# Patient Record
Sex: Female | Born: 1960 | Race: Black or African American | Hispanic: No | Marital: Single | Smoking: Never smoker
Health system: Southern US, Community
[De-identification: ages and names within clinical notes are randomized; demographics above are authoritative.]

## PROBLEM LIST (undated history)

## (undated) DIAGNOSIS — Z973 Presence of spectacles and contact lenses: Secondary | ICD-10-CM

## (undated) DIAGNOSIS — Z862 Personal history of diseases of the blood and blood-forming organs and certain disorders involving the immune mechanism: Secondary | ICD-10-CM

## (undated) DIAGNOSIS — E559 Vitamin D deficiency, unspecified: Secondary | ICD-10-CM

## (undated) DIAGNOSIS — M545 Low back pain, unspecified: Secondary | ICD-10-CM

## (undated) DIAGNOSIS — S62109A Fracture of unspecified carpal bone, unspecified wrist, initial encounter for closed fracture: Secondary | ICD-10-CM

## (undated) DIAGNOSIS — F419 Anxiety disorder, unspecified: Secondary | ICD-10-CM

## (undated) DIAGNOSIS — E7800 Pure hypercholesterolemia, unspecified: Secondary | ICD-10-CM

## (undated) DIAGNOSIS — I10 Essential (primary) hypertension: Secondary | ICD-10-CM

## (undated) DIAGNOSIS — N390 Urinary tract infection, site not specified: Secondary | ICD-10-CM

## (undated) HISTORY — DX: Pure hypercholesterolemia, unspecified: E78.00

## (undated) HISTORY — PX: ABLATION: SHX5711

## (undated) HISTORY — PX: ANKLE SURGERY: SHX546

---

## 2001-04-12 ENCOUNTER — Other Ambulatory Visit: Admission: RE | Admit: 2001-04-12 | Discharge: 2001-04-12 | Payer: Self-pay | Admitting: Obstetrics and Gynecology

## 2002-05-16 ENCOUNTER — Other Ambulatory Visit: Admission: RE | Admit: 2002-05-16 | Discharge: 2002-05-16 | Payer: Self-pay | Admitting: Obstetrics and Gynecology

## 2003-06-05 ENCOUNTER — Other Ambulatory Visit: Admission: RE | Admit: 2003-06-05 | Discharge: 2003-06-05 | Payer: Self-pay | Admitting: Obstetrics and Gynecology

## 2005-04-05 ENCOUNTER — Other Ambulatory Visit: Admission: RE | Admit: 2005-04-05 | Discharge: 2005-04-05 | Payer: Self-pay | Admitting: Obstetrics and Gynecology

## 2008-05-22 ENCOUNTER — Ambulatory Visit: Payer: Self-pay | Admitting: Radiology

## 2008-05-22 ENCOUNTER — Emergency Department (HOSPITAL_BASED_OUTPATIENT_CLINIC_OR_DEPARTMENT_OTHER): Admission: EM | Admit: 2008-05-22 | Discharge: 2008-05-22 | Payer: Self-pay | Admitting: Emergency Medicine

## 2010-03-24 IMAGING — CR DG ANKLE COMPLETE 3+V*L*
3 series · 3 of 3 positions shown · non-contrast
Comparison: None.

Addendum Begins

Body of the report should read; left fifth metatarsal head
Addendum Ends
CLINICAL DATA: Twisting injury.
LEFT ANKLE COMPLETE - 3+ VIEW

[t ankle joint ap left]
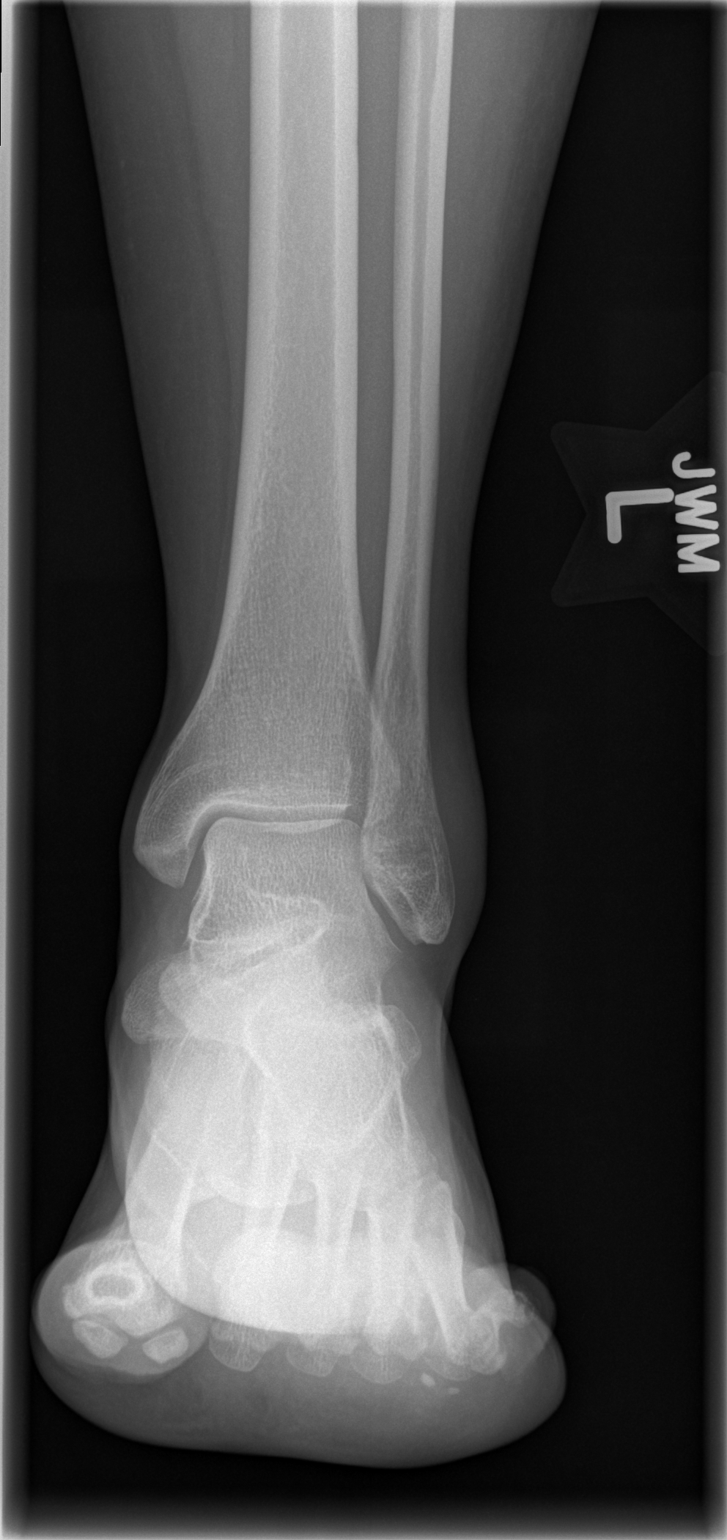

[t ankle joint oblique left]
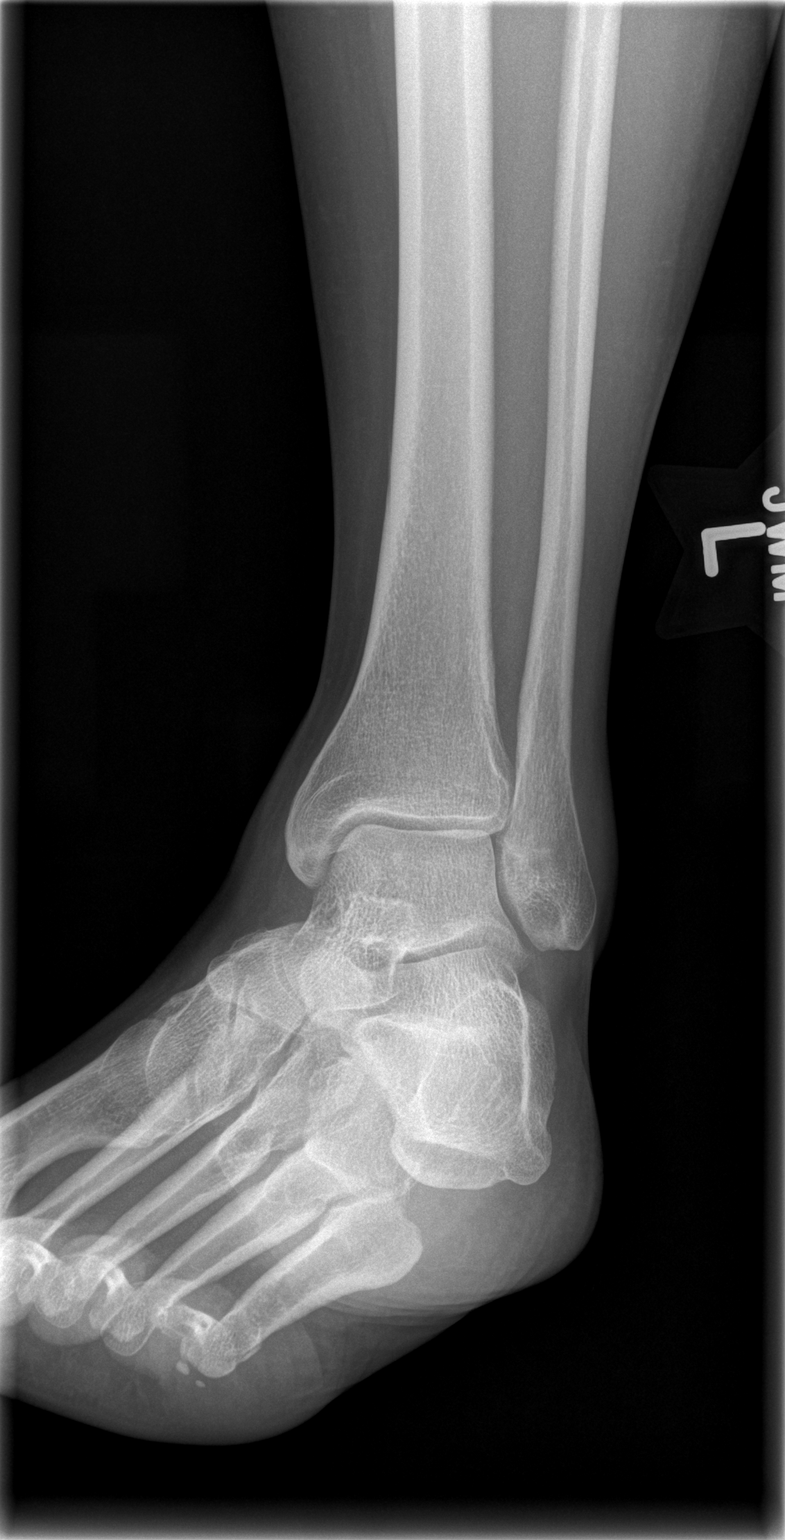

[t ankle joint lat left]
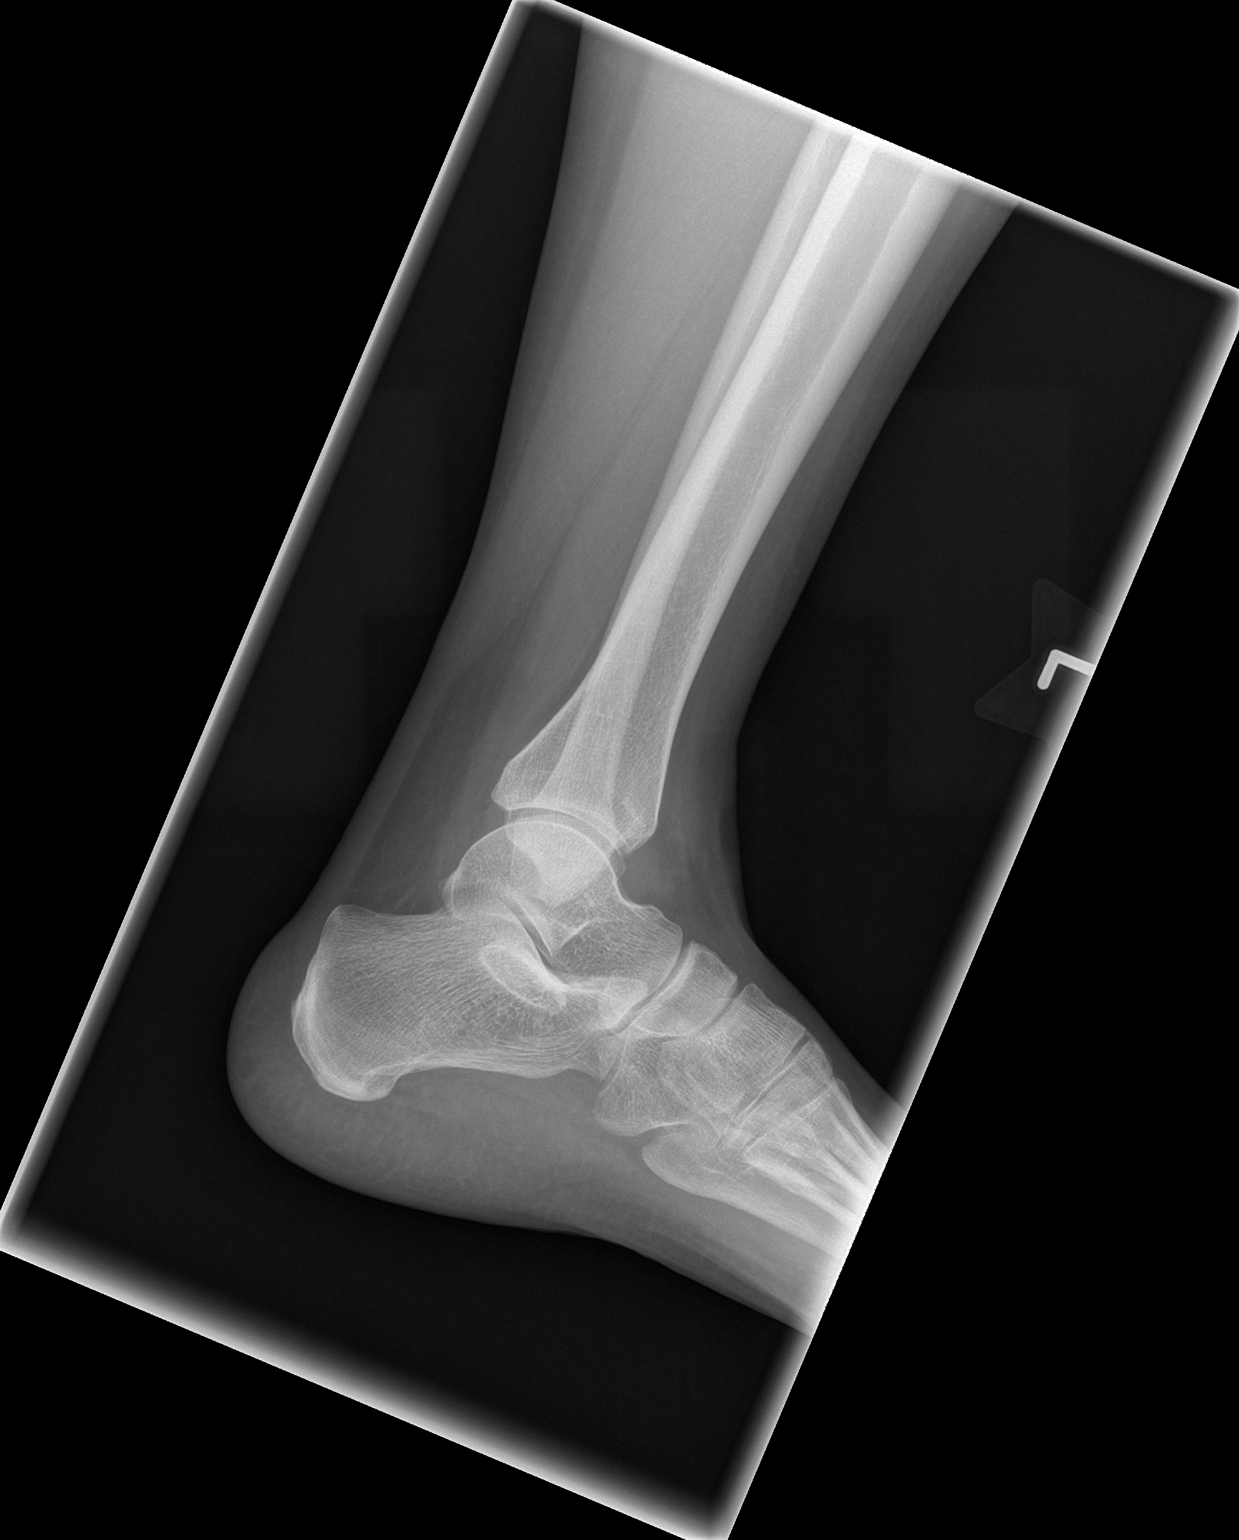

[3 of 3 positions shown; findings below may reference images not displayed]

FINDINGS: No fracture or dislocation involving the ankle.  On one
view there is a questionable abnormality of the left fifth
metacarpal head which I suspect is related to projection however if
the patient were tender here then cone down views recommended.
IMPRESSION: No ankle fracture.

Question artifact involving the left fifth metatarsal head as
discussed above

## 2011-10-25 ENCOUNTER — Encounter (HOSPITAL_BASED_OUTPATIENT_CLINIC_OR_DEPARTMENT_OTHER): Payer: Self-pay | Admitting: *Deleted

## 2011-10-25 ENCOUNTER — Emergency Department (HOSPITAL_BASED_OUTPATIENT_CLINIC_OR_DEPARTMENT_OTHER)
Admission: EM | Admit: 2011-10-25 | Discharge: 2011-10-25 | Disposition: A | Discharge status: Home / Self Care | Payer: Managed Care, Other (non HMO) | Attending: Emergency Medicine | Admitting: Emergency Medicine

## 2011-10-25 DIAGNOSIS — R109 Unspecified abdominal pain: Secondary | ICD-10-CM | POA: Insufficient documentation

## 2011-10-25 DIAGNOSIS — N83201 Unspecified ovarian cyst, right side: Secondary | ICD-10-CM

## 2011-10-25 DIAGNOSIS — E876 Hypokalemia: Secondary | ICD-10-CM

## 2011-10-25 DIAGNOSIS — N83209 Unspecified ovarian cyst, unspecified side: Secondary | ICD-10-CM | POA: Insufficient documentation

## 2011-10-25 LAB — URINALYSIS, ROUTINE W REFLEX MICROSCOPIC
Nitrite: NEGATIVE
Specific Gravity, Urine: 1.027 (ref 1.005–1.030)
Urobilinogen, UA: 0.2 mg/dL (ref 0.0–1.0)

## 2011-10-25 LAB — WET PREP, GENITAL

## 2011-10-25 LAB — CBC WITH DIFFERENTIAL/PLATELET
Eosinophils Relative: 1 % (ref 0–5)
MCH: 22.7 pg — ABNORMAL LOW (ref 26.0–34.0)
Monocytes Absolute: 0.4 10*3/uL (ref 0.1–1.0)
Monocytes Relative: 9 % (ref 3–12)
Neutrophils Relative %: 48 % (ref 43–77)
Platelets: 190 10*3/uL (ref 150–400)
RBC: 5.43 MIL/uL — ABNORMAL HIGH (ref 3.87–5.11)
WBC: 4.2 10*3/uL (ref 4.0–10.5)

## 2011-10-25 LAB — BASIC METABOLIC PANEL
BUN: 10 mg/dL (ref 6–23)
Calcium: 8.8 mg/dL (ref 8.4–10.5)
GFR calc Af Amer: >90 mL/min (ref >90)
GFR calc non Af Amer: >90 mL/min (ref >90)
Potassium: 2.6 mEq/L — CL (ref 3.5–5.1)

## 2011-10-25 MED ORDER — NAPROXEN 500 MG PO TABS: 500.0000 mg | ORAL_TABLET | Freq: Two times a day (BID) | ORAL | Status: AC

## 2011-10-25 MED ORDER — POTASSIUM CHLORIDE CRYS ER 20 MEQ PO TBCR
40.0000 meq | EXTENDED_RELEASE_TABLET | Freq: Once | ORAL | Status: AC
  Administered 2011-10-25: 40 meq via ORAL
  Filled 2011-10-25: qty 2

## 2011-10-25 MED ORDER — OXYCODONE-ACETAMINOPHEN 5-325 MG PO TABS: 1.0000 | ORAL_TABLET | ORAL | Status: AC | PRN

## 2011-10-25 MED ORDER — POTASSIUM CHLORIDE CRYS ER 20 MEQ PO TBCR: 40.0000 meq | EXTENDED_RELEASE_TABLET | Freq: Two times a day (BID) | ORAL | Status: DC

## 2011-10-25 MED ORDER — POTASSIUM CHLORIDE 10 MEQ/100ML IV SOLN
10.0000 meq | Freq: Once | INTRAVENOUS | Status: AC
  Administered 2011-10-25: 10 meq via INTRAVENOUS
  Filled 2011-10-25: qty 100

## 2011-10-25 NOTE — ED Notes (Signed)
Pt sts pain not present now, it comes and goes

## 2011-10-25 NOTE — ED Notes (Signed)
MD notified of potassium 2.6.  

## 2011-10-25 NOTE — ED Provider Notes (Signed)
History     CSN: [id number]  Arrival date & time 10/25/11  0344   First MD Initiated Contact with Patient 10/25/11 0454      Chief Complaint  Patient presents with  . Abdominal Pain    (Consider location/radiation/quality/duration/timing/severity/associated sxs/prior treatment) Patient is a 51 y.o. female presenting with abdominal pain. The history is provided by the patient.  Abdominal Pain The primary symptoms of the illness include abdominal pain.  She has noticed some right suprapubic pain which has been present intermittently for the last 4 days. Pain comes and goes without any particular pattern but can be present for several hours at a time. It is moderate in intensity with worse pain having been 5/10. She currently is pain free. Pain seems to be worse when she is laying down and that does seem to be improved following ibuprofen. It is dull and crampy in nature and does not radiate. There is no associated nausea, vomiting, diarrhea. Her appetite has been normal. There has been no urinary urgency, frequency, and tenesmus. She has noted a vaginal discharge. She is status post uterine ablation so she is not aware of her menstrual cycle.  History reviewed. No pertinent past medical history.  History reviewed. No pertinent past surgical history.  History reviewed. No pertinent family history.  History  Substance Use Topics  . Smoking status: Never Smoker   . Smokeless tobacco: Not on file  . Alcohol Use: Yes    OB History    Grav Para Term Preterm Abortions TAB SAB Ect Mult Living                  Review of Systems  Gastrointestinal: Positive for abdominal pain.  All other systems reviewed and are negative.    Allergies  Review of patient's allergies indicates no known allergies.  Home Medications   Current Outpatient Rx  Name Route Sig Dispense Refill  . ALPRAZOLAM 1 MG PO TABS Oral Take 1 mg by mouth at bedtime as needed.    Marland Kitchen PHENTERMINE HCL 37.5 MG PO CAPS  Oral Take 37.5 mg by mouth every morning.      BP 124/76  Pulse 80  Temp 97.8 F (36.6 C) (Oral)  Resp 19  Ht 5\' 2"  (1.575 m)  Wt 155 lb (70.308 kg)  BMI 28.35 kg/m2  SpO2 99%  Physical Exam  Nursing note and vitals reviewed.  51 year old female who is resting comfortably and in no acute distress. Vital signs are normal. Oxygen saturation is 99% which is normal. Head is normocephalic and atraumatic. PERRLA, EOMI. Neck is nontender and supple. Back is nontender. Lungs are clear without rales, wheezes, or rhonchi. Heart has regular rate and rhythm without murmur. Abdomen is soft, flat, with mild right suprapubic tenderness. There is no rebound or guarding. Peristalsis is normal active. No masses or hepatosplenomegaly. Extremities have no cyanosis or edema, full range of motion is present. Skin is warm and dry without rash. Neurologic: Mental status is normal, cranial nerves are intact, there are no motor or sensory deficits.  ED Course  Procedures (including critical care time)  Results for orders placed during the hospital encounter of 10/25/11  URINALYSIS, ROUTINE W REFLEX MICROSCOPIC      Component Value Range   Color, Urine AMBER (*) YELLOW   APPearance CLOUDY (*) CLEAR   Specific Gravity, Urine 1.027  1.005 - 1.030   pH 6.0  5.0 - 8.0   Glucose, UA NEGATIVE  NEGATIVE mg/dL   Hgb urine  dipstick NEGATIVE  NEGATIVE   Bilirubin Urine SMALL (*) NEGATIVE   Ketones, ur 15 (*) NEGATIVE mg/dL   Protein, ur NEGATIVE  NEGATIVE mg/dL   Urobilinogen, UA 0.2  0.0 - 1.0 mg/dL   Nitrite NEGATIVE  NEGATIVE   Leukocytes, UA NEGATIVE  NEGATIVE  CBC WITH DIFFERENTIAL      Component Value Range   WBC 4.2  4.0 - 10.5 K/uL   RBC 5.43 (*) 3.87 - 5.11 MIL/uL   Hemoglobin 12.3  12.0 - 15.0 g/dL   HCT 16.1 (*) 09.6 - 04.5 %   MCV 65.6 (*) 78.0 - 100.0 fL   MCH 22.7 (*) 26.0 - 34.0 pg   MCHC 34.6  30.0 - 36.0 g/dL   RDW 40.9  81.1 - 91.4 %   Platelets 190  150 - 400 K/uL   Neutrophils Relative  48  43 - 77 %   Lymphocytes Relative 42  12 - 46 %   Monocytes Relative 9  3 - 12 %   Eosinophils Relative 1  0 - 5 %   Basophils Relative 0  0 - 1 %   Neutro Abs 2.0  1.7 - 7.7 K/uL   Lymphs Abs 1.8  0.7 - 4.0 K/uL   Monocytes Absolute 0.4  0.1 - 1.0 K/uL   Eosinophils Absolute 0.0  0.0 - 0.7 K/uL   Basophils Absolute 0.0  0.0 - 0.1 K/uL   RBC Morphology ELLIPTOCYTES     WBC Morphology WHITE COUNT CONFIRMED ON SMEAR     Smear Review PLATELET COUNT CONFIRMED BY SMEAR    BASIC METABOLIC PANEL      Component Value Range   Sodium 139  135 - 145 mEq/L   Potassium 2.6 (*) 3.5 - 5.1 mEq/L   Chloride 102  96 - 112 mEq/L   CO2 26  19 - 32 mEq/L   Glucose, Bld 96  70 - 99 mg/dL   BUN 10  6 - 23 mg/dL   Creatinine, Ser 7.82  0.50 - 1.10 mg/dL   Calcium 8.8  8.4 - 95.6 mg/dL   GFR calc non Af Amer >90  >90 mL/min   GFR calc Af Amer >90  >90 mL/min  WET PREP, GENITAL      Component Value Range   Yeast Wet Prep HPF POC NONE SEEN  NONE SEEN   Trich, Wet Prep NONE SEEN  NONE SEEN   Clue Cells Wet Prep HPF POC FEW (*) NONE SEEN   WBC, Wet Prep HPF POC FEW (*) NONE SEEN     1. Right ovarian cyst   2. Hypokalemia       MDM  Right pelvic pain which is most likely related to ovarian cyst. Although I doubt appendicitis, WBC will be checked and, if elevated, will consider CT scan.  Pelvic exam shows normal external female genitalia. Cervix appears normal. There is a moderate creamy white vaginal discharge present. His fundus is normal size and position and nontender and there is no cervical motion tenderness. There is no left adnexal tenderness or mass. There is tenderness to palpation over the right ovary which is normal size. No other adnexal mass or tenderness identified. At this point, it seems fairly clear that her pain is related to the right ovary.  WBC is normal, but potassium came back unexpectedly very low-2.6. At this level, she will need a dose of potassium intravenously as well as  oral potassium. She'll be sent home with prescriptions for naproxen, potassium,  and Percocet. She is instructed to follow up with her PCP in 2 weeks to repeat the potassium.    Dione Booze, MD 10/25/11 [phone]

## 2011-10-25 NOTE — ED Notes (Signed)
C/o RLQ pain and vaginal d/c for few days, denies urinary sxs.

## 2012-10-28 ENCOUNTER — Emergency Department (HOSPITAL_BASED_OUTPATIENT_CLINIC_OR_DEPARTMENT_OTHER): Payer: Managed Care, Other (non HMO)

## 2012-10-28 ENCOUNTER — Encounter (HOSPITAL_BASED_OUTPATIENT_CLINIC_OR_DEPARTMENT_OTHER): Payer: Self-pay | Admitting: *Deleted

## 2012-10-28 ENCOUNTER — Emergency Department (HOSPITAL_BASED_OUTPATIENT_CLINIC_OR_DEPARTMENT_OTHER)
Admission: EM | Admit: 2012-10-28 | Discharge: 2012-10-28 | Disposition: A | Discharge status: Home / Self Care | Payer: Managed Care, Other (non HMO) | Attending: Emergency Medicine | Admitting: Emergency Medicine

## 2012-10-28 DIAGNOSIS — S8990XA Unspecified injury of unspecified lower leg, initial encounter: Secondary | ICD-10-CM | POA: Insufficient documentation

## 2012-10-28 DIAGNOSIS — M25561 Pain in right knee: Secondary | ICD-10-CM

## 2012-10-28 DIAGNOSIS — Y9289 Other specified places as the place of occurrence of the external cause: Secondary | ICD-10-CM | POA: Insufficient documentation

## 2012-10-28 DIAGNOSIS — Z79899 Other long term (current) drug therapy: Secondary | ICD-10-CM | POA: Insufficient documentation

## 2012-10-28 DIAGNOSIS — Y9389 Activity, other specified: Secondary | ICD-10-CM | POA: Insufficient documentation

## 2012-10-28 DIAGNOSIS — R52 Pain, unspecified: Secondary | ICD-10-CM | POA: Insufficient documentation

## 2012-10-28 DIAGNOSIS — X500XXA Overexertion from strenuous movement or load, initial encounter: Secondary | ICD-10-CM | POA: Insufficient documentation

## 2012-10-28 MED ORDER — HYDROCODONE-ACETAMINOPHEN 5-325 MG PO TABS: 2.0000 | ORAL_TABLET | ORAL | Status: DC | PRN

## 2012-10-28 NOTE — ED Notes (Signed)
Pt reports that she has had (R) knee pain x 1 week.  Pt ambulatory.
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
## 2012-11-05 ENCOUNTER — Ambulatory Visit (INDEPENDENT_AMBULATORY_CARE_PROVIDER_SITE_OTHER): Payer: Managed Care, Other (non HMO) | Admitting: Family Medicine

## 2012-11-05 ENCOUNTER — Encounter: Payer: Self-pay | Admitting: Family Medicine

## 2012-11-05 VITALS — BP 131/87 | HR 79 | Ht 62.0 in | Wt 144.0 lb

## 2012-11-05 DIAGNOSIS — M25569 Pain in unspecified knee: Secondary | ICD-10-CM

## 2012-11-05 DIAGNOSIS — M25561 Pain in right knee: Secondary | ICD-10-CM

## 2012-11-05 NOTE — Patient Instructions (Addendum)
You have a medial meniscus tear of your knee. Take tylenol 500mg  1-2 tabs three times a day for pain. Aleve 1-2 tabs twice a day with food Capsaicin topically up to four times a day may also help with pain. Cortisone injections are an option though you would only do one for this issue. Consider physical therapy to strengthen muscles around the joint that hurts to take pressure off of the joint itself. Start straight leg raises and knee extensions 3 sets of 10 once a day with ankle weight. Heat or ice 15 minutes at a time 3-4 times a day as needed to help with pain. Follow up with me in 1 month for reevaluation.

## 2012-11-06 ENCOUNTER — Encounter: Payer: Self-pay | Admitting: Family Medicine

## 2012-11-06 DIAGNOSIS — M25561 Pain in right knee: Secondary | ICD-10-CM | POA: Insufficient documentation
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
## 2012-11-06 NOTE — Assessment & Plan Note (Signed)
radiographs normal without arthritis.  Location, injury, and exam consistent with medial meniscus tear.  Start with conservative therapy.  Icing, tylenol, aleve, capsaicin.  Consider PT, cortisone injection.  Shown home exercise program.  F/u in 1 month for reevaluation.

## 2012-12-07 ENCOUNTER — Ambulatory Visit: Payer: Managed Care, Other (non HMO) | Admitting: Family Medicine

## 2012-12-10 ENCOUNTER — Encounter: Payer: Self-pay | Admitting: Family Medicine

## 2012-12-10 ENCOUNTER — Ambulatory Visit (INDEPENDENT_AMBULATORY_CARE_PROVIDER_SITE_OTHER): Payer: Managed Care, Other (non HMO) | Admitting: Family Medicine

## 2012-12-10 VITALS — BP 134/87 | HR 72 | Ht 62.0 in | Wt 144.0 lb

## 2012-12-10 DIAGNOSIS — M25561 Pain in right knee: Secondary | ICD-10-CM

## 2012-12-10 DIAGNOSIS — M25569 Pain in unspecified knee: Secondary | ICD-10-CM

## 2012-12-10 NOTE — Patient Instructions (Addendum)
We will go ahead with an MRI to further evaluate your knee, confirm meniscus tear. I will call you the business day following this with the results.

## 2012-12-11 ENCOUNTER — Encounter: Payer: Self-pay | Admitting: Family Medicine
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
## 2012-12-11 NOTE — Assessment & Plan Note (Signed)
radiographs normal without arthritis.  Location, injury, and exam consistent with medial meniscus tear.  Not improving with HEP, icing, tylenol, nsaids (aleve).  Will move forward with MRI to assess for medial meniscus tear.

## 2013-03-12 ENCOUNTER — Emergency Department (HOSPITAL_BASED_OUTPATIENT_CLINIC_OR_DEPARTMENT_OTHER)
Admission: EM | Admit: 2013-03-12 | Discharge: 2013-03-12 | Disposition: A | Discharge status: Home / Self Care | Payer: Managed Care, Other (non HMO) | Attending: Emergency Medicine | Admitting: Emergency Medicine

## 2013-03-12 ENCOUNTER — Encounter (HOSPITAL_BASED_OUTPATIENT_CLINIC_OR_DEPARTMENT_OTHER): Payer: Self-pay | Admitting: Emergency Medicine

## 2013-03-12 DIAGNOSIS — M549 Dorsalgia, unspecified: Secondary | ICD-10-CM

## 2013-03-12 DIAGNOSIS — M545 Low back pain, unspecified: Secondary | ICD-10-CM | POA: Insufficient documentation

## 2013-03-12 DIAGNOSIS — F411 Generalized anxiety disorder: Secondary | ICD-10-CM | POA: Insufficient documentation

## 2013-03-12 DIAGNOSIS — Z79899 Other long term (current) drug therapy: Secondary | ICD-10-CM | POA: Insufficient documentation

## 2013-03-12 DIAGNOSIS — Z791 Long term (current) use of non-steroidal anti-inflammatories (NSAID): Secondary | ICD-10-CM | POA: Insufficient documentation

## 2013-03-12 DIAGNOSIS — Z3202 Encounter for pregnancy test, result negative: Secondary | ICD-10-CM | POA: Insufficient documentation

## 2013-03-12 HISTORY — DX: Anxiety disorder, unspecified: F41.9

## 2013-03-12 LAB — URINALYSIS, ROUTINE W REFLEX MICROSCOPIC
Leukocytes, UA: NEGATIVE
Nitrite: NEGATIVE
Specific Gravity, Urine: 1.027 (ref 1.005–1.030)
pH: 5.5 (ref 5.0–8.0)

## 2013-03-12 LAB — PREGNANCY, URINE: Preg Test, Ur: NEGATIVE

## 2013-03-12 MED ORDER — HYDROCODONE-ACETAMINOPHEN 5-325 MG PO TABS: 2.0000 | ORAL_TABLET | Freq: Once | ORAL | Status: DC

## 2013-03-12 MED ORDER — IBUPROFEN 400 MG PO TABS
600.0000 mg | ORAL_TABLET | Freq: Once | ORAL | Status: AC
  Administered 2013-03-12: 600 mg via ORAL
  Filled 2013-03-12 (×2): qty 1

## 2013-03-12 MED ORDER — IBUPROFEN 600 MG PO TABS: 600.0000 mg | ORAL_TABLET | Freq: Three times a day (TID) | ORAL | Status: DC

## 2013-03-12 MED ORDER — DIAZEPAM 5 MG PO TABS: 5.0000 mg | ORAL_TABLET | Freq: Three times a day (TID) | ORAL | Status: DC | PRN

## 2013-03-12 MED ORDER — HYDROCODONE-ACETAMINOPHEN 5-325 MG PO TABS
1.0000 | ORAL_TABLET | Freq: Once | ORAL | Status: AC
  Administered 2013-03-12: 1 via ORAL
  Filled 2013-03-12: qty 1

## 2013-03-12 MED ORDER — DIAZEPAM 5 MG PO TABS
5.0000 mg | ORAL_TABLET | Freq: Once | ORAL | Status: AC
  Administered 2013-03-12: 5 mg via ORAL
  Filled 2013-03-12: qty 1

## 2013-03-12 MED ORDER — HYDROCODONE-ACETAMINOPHEN 5-325 MG PO TABS: 1.0000 | ORAL_TABLET | Freq: Four times a day (QID) | ORAL | Status: DC | PRN

## 2013-03-12 NOTE — ED Provider Notes (Signed)
CSN: [id number]     Arrival date & time 03/12/13  1044 History   First MD Initiated Contact with Patient 03/12/13 1051     Chief Complaint  Patient presents with  . Back Pain   (Consider location/radiation/quality/duration/timing/severity/associated sxs/prior Treatment) Patient is a 52 y.o. female presenting with back pain. The history is provided by the patient.  Back Pain Location:  Lumbar spine Quality:  Aching Radiates to:  Does not radiate Pain severity:  Moderate Pain is:  Same all the time Onset quality:  Sudden Timing:  Constant Progression:  Unchanged Chronicity:  New Context: twisting (with bending over)   Relieved by:  Nothing Worsened by:  Bending, twisting and movement Associated symptoms: no abdominal pain, no chest pain and no fever     Past Medical History  Diagnosis Date  . Anxiety    Past Surgical History  Procedure Laterality Date  . Cesarean section    . Ablation     Family History  Problem Relation Age of Onset  . Diabetes Mother   . Hyperlipidemia Mother   . Hypertension Mother   . Heart attack Father   . Hyperlipidemia Father   . Sudden death Neg Hx    History  Substance Use Topics  . Smoking status: Never Smoker   . Smokeless tobacco: Not on file  . Alcohol Use: Yes     Comment: socially   OB History   Grav Para Term Preterm Abortions TAB SAB Ect Mult Living                 Review of Systems  Constitutional: Negative for fever.  Cardiovascular: Negative for chest pain and leg swelling.  Gastrointestinal: Negative for nausea, vomiting, abdominal pain and diarrhea.  Genitourinary: Negative for difficulty urinating.  Musculoskeletal: Positive for back pain.  All other systems reviewed and are negative.    Allergies  Review of patient's allergies indicates no known allergies.  Home Medications   Current Outpatient Rx  Name  Route  Sig  Dispense  Refill  . ALPRAZolam (XANAX) 1 MG tablet   Oral   Take 1 mg by mouth at bedtime  as needed.         . diazepam (VALIUM) 5 MG tablet   Oral   Take 1 tablet (5 mg total) by mouth every 8 (eight) hours as needed for muscle spasms.   30 tablet   0   . HYDROcodone-acetaminophen (NORCO/VICODIN) 5-325 MG per tablet   Oral   Take 2 tablets by mouth every 4 (four) hours as needed for pain.   15 tablet   0   . HYDROcodone-acetaminophen (NORCO/VICODIN) 5-325 MG per tablet   Oral   Take 1 tablet by mouth every 6 (six) hours as needed for moderate pain.   20 tablet   0   . ibuprofen (ADVIL,MOTRIN) 600 MG tablet   Oral   Take 1 tablet (600 mg total) by mouth 3 (three) times daily.   21 tablet   0   . phentermine 37.5 MG capsule   Oral   Take 37.5 mg by mouth every morning.         Marland Kitchen EXPIRED: potassium chloride SA (K-DUR,KLOR-CON) 20 MEQ tablet   Oral   Take 2 tablets (40 mEq total) by mouth 2 (two) times daily.   40 tablet   0    BP 142/68  Pulse 76  Temp(Src) 98.3 F (36.8 C) (Oral)  Resp 16  Ht 5\' 2"  (1.575 m)  Wt 151 lb (68.493 kg)  BMI 27.61 kg/m2  SpO2 100% Physical Exam  Nursing note and vitals reviewed. Constitutional: She is oriented to person, place, and time. She appears well-developed and well-nourished. No distress.  HENT:  Head: Normocephalic and atraumatic.  Eyes: EOM are normal. Pupils are equal, round, and reactive to light.  Neck: Normal range of motion. Neck supple.  Cardiovascular: Normal rate and regular rhythm.  Exam reveals no friction rub.   No murmur heard. Pulmonary/Chest: Effort normal and breath sounds normal. No respiratory distress. She has no wheezes. She has no rales.  Abdominal: Soft. She exhibits no distension. There is no tenderness. There is no rebound.  Musculoskeletal: Normal range of motion. She exhibits no edema.       Lumbar back: She exhibits normal range of motion, no tenderness, no bony tenderness and no spasm.  Neurological: She is alert and oriented to person, place, and time.  Skin: She is not  diaphoretic.    ED Course  Procedures (including critical care time) Labs Review Labs Reviewed  URINALYSIS, ROUTINE W REFLEX MICROSCOPIC - Abnormal; Notable for the following:    Color, Urine AMBER (*)    Bilirubin Urine SMALL (*)    All other components within normal limits  PREGNANCY, URINE   Imaging Review No results found.  EKG Interpretation   None       MDM   1. Back pain    41F here with back pain. Began while bending over. Nonradiating, localized in L lower back. No urinary/fecal incontinence/retention. No N/V/D. No fevers. No reported saddle anesthesia. On exam, no muscle spasm. Normal reflexes, normal strength and sensation. Likely lumbar strain. Patient given valium and narcotics for her symptoms.     Dagmar Hait, MD 03/12/13 1400

## 2013-03-12 NOTE — ED Notes (Signed)
Pt reports she bent over yesterday and now has low back pain radiating to left flank.

## 2017-06-22 LAB — HEPATIC FUNCTION PANEL
ALT: 38 — AB (ref 7–35)
AST: 38 — AB (ref 13–35)

## 2017-06-22 LAB — LIPID PANEL
CHOLESTEROL: 206 — AB (ref 0–200)
HDL: 62 (ref 35–70)
LDL Cholesterol: 124
Triglycerides: 94 (ref 40–160)

## 2017-06-22 LAB — BASIC METABOLIC PANEL: POTASSIUM: 3.3 — AB (ref 3.4–5.3)

## 2017-06-27 ENCOUNTER — Encounter: Payer: Self-pay | Admitting: Internal Medicine

## 2017-06-27 ENCOUNTER — Ambulatory Visit: Payer: Managed Care, Other (non HMO) | Admitting: Internal Medicine

## 2017-06-27 VITALS — BP 130/78 | HR 103 | Temp 97.9°F | Ht 62.0 in | Wt 179.0 lb

## 2017-06-27 DIAGNOSIS — Z0000 Encounter for general adult medical examination without abnormal findings: Secondary | ICD-10-CM | POA: Diagnosis not present

## 2017-06-27 DIAGNOSIS — Z1211 Encounter for screening for malignant neoplasm of colon: Secondary | ICD-10-CM | POA: Diagnosis not present

## 2017-06-27 DIAGNOSIS — Z23 Encounter for immunization: Secondary | ICD-10-CM

## 2017-06-27 NOTE — Addendum Note (Signed)
Addended by: Eual FinesBRIDGES, SHANNON P on: 06/27/2017 12:47 PM   Modules accepted: Orders

## 2017-06-27 NOTE — Patient Instructions (Signed)
DASH Eating Plan DASH stands for "Dietary Approaches to Stop Hypertension." The DASH eating plan is a healthy eating plan that has been shown to reduce high blood pressure (hypertension). It may also reduce your risk for type 2 diabetes, heart disease, and stroke. The DASH eating plan may also help with weight loss. What are tips for following this plan? General guidelines  Avoid eating more than 2,300 mg (milligrams) of salt (sodium) a day. If you have hypertension, you may need to reduce your sodium intake to 1,500 mg a day.  Limit alcohol intake to no more than 1 drink a day for nonpregnant women and 2 drinks a day for men. One drink equals 12 oz of beer, 5 oz of wine, or 1 oz of hard liquor.  Work with your health care provider to maintain a healthy body weight or to lose weight. Ask what an ideal weight is for you.  Get at least 30 minutes of exercise that causes your heart to beat faster (aerobic exercise) most days of the week. Activities may include walking, swimming, or biking.  Work with your health care provider or diet and nutrition specialist (dietitian) to adjust your eating plan to your individual calorie needs. Reading food labels  Check food labels for the amount of sodium per serving. Choose foods with less than 5 percent of the Daily Value of sodium. Generally, foods with less than 300 mg of sodium per serving fit into this eating plan.  To find whole grains, look for the word "whole" as the first word in the ingredient list. Shopping  Buy products labeled as "low-sodium" or "no salt added."  Buy fresh foods. Avoid canned foods and premade or frozen meals. Cooking  Avoid adding salt when cooking. Use salt-free seasonings or herbs instead of table salt or sea salt. Check with your health care provider or pharmacist before using salt substitutes.  Do not fry foods. Cook foods using healthy methods such as baking, boiling, grilling, and broiling instead.  Cook with  heart-healthy oils, such as olive, canola, soybean, or sunflower oil. Meal planning   Eat a balanced diet that includes: ? 5 or more servings of fruits and vegetables each day. At each meal, try to fill half of your plate with fruits and vegetables. ? Up to 6-8 servings of whole grains each day. ? Less than 6 oz of lean meat, poultry, or fish each day. A 3-oz serving of meat is about the same size as a deck of cards. One egg equals 1 oz. ? 2 servings of low-fat dairy each day. ? A serving of nuts, seeds, or beans 5 times each week. ? Heart-healthy fats. Healthy fats called Omega-3 fatty acids are found in foods such as flaxseeds and coldwater fish, like sardines, salmon, and mackerel.  Limit how much you eat of the following: ? Canned or prepackaged foods. ? Food that is high in trans fat, such as fried foods. ? Food that is high in saturated fat, such as fatty meat. ? Sweets, desserts, sugary drinks, and other foods with added sugar. ? Full-fat dairy products.  Do not salt foods before eating.  Try to eat at least 2 vegetarian meals each week.  Eat more home-cooked food and less restaurant, buffet, and fast food.  When eating at a restaurant, ask that your food be prepared with less salt or no salt, if possible. What foods are recommended? The items listed may not be a complete list. Talk with your dietitian about what   dietary choices are best for you. Grains Whole-grain or whole-wheat bread. Whole-grain or whole-wheat pasta. Brown rice. Oatmeal. Quinoa. Bulgur. Whole-grain and low-sodium cereals. Pita bread. Low-fat, low-sodium crackers. Whole-wheat flour tortillas. Vegetables Fresh or frozen vegetables (raw, steamed, roasted, or grilled). Low-sodium or reduced-sodium tomato and vegetable juice. Low-sodium or reduced-sodium tomato sauce and tomato paste. Low-sodium or reduced-sodium canned vegetables. Fruits All fresh, dried, or frozen fruit. Canned fruit in natural juice (without  added sugar). Meat and other protein foods Skinless chicken or turkey. Ground chicken or turkey. Pork with fat trimmed off. Fish and seafood. Egg whites. Dried beans, peas, or lentils. Unsalted nuts, nut butters, and seeds. Unsalted canned beans. Lean cuts of beef with fat trimmed off. Low-sodium, lean deli meat. Dairy Low-fat (1%) or fat-free (skim) milk. Fat-free, low-fat, or reduced-fat cheeses. Nonfat, low-sodium ricotta or cottage cheese. Low-fat or nonfat yogurt. Low-fat, low-sodium cheese. Fats and oils Soft margarine without trans fats. Vegetable oil. Low-fat, reduced-fat, or light mayonnaise and salad dressings (reduced-sodium). Canola, safflower, olive, soybean, and sunflower oils. Avocado. Seasoning and other foods Herbs. Spices. Seasoning mixes without salt. Unsalted popcorn and pretzels. Fat-free sweets. What foods are not recommended? The items listed may not be a complete list. Talk with your dietitian about what dietary choices are best for you. Grains Baked goods made with fat, such as croissants, muffins, or some breads. Dry pasta or rice meal packs. Vegetables Creamed or fried vegetables. Vegetables in a cheese sauce. Regular canned vegetables (not low-sodium or reduced-sodium). Regular canned tomato sauce and paste (not low-sodium or reduced-sodium). Regular tomato and vegetable juice (not low-sodium or reduced-sodium). Pickles. Olives. Fruits Canned fruit in a light or heavy syrup. Fried fruit. Fruit in cream or butter sauce. Meat and other protein foods Fatty cuts of meat. Ribs. Fried meat. Bacon. Sausage. Bologna and other processed lunch meats. Salami. Fatback. Hotdogs. Bratwurst. Salted nuts and seeds. Canned beans with added salt. Canned or smoked fish. Whole eggs or egg yolks. Chicken or turkey with skin. Dairy Whole or 2% milk, cream, and half-and-half. Whole or full-fat cream cheese. Whole-fat or sweetened yogurt. Full-fat cheese. Nondairy creamers. Whipped toppings.  Processed cheese and cheese spreads. Fats and oils Butter. Stick margarine. Lard. Shortening. Ghee. Bacon fat. Tropical oils, such as coconut, palm kernel, or palm oil. Seasoning and other foods Salted popcorn and pretzels. Onion salt, garlic salt, seasoned salt, table salt, and sea salt. Worcestershire sauce. Tartar sauce. Barbecue sauce. Teriyaki sauce. Soy sauce, including reduced-sodium. Steak sauce. Canned and packaged gravies. Fish sauce. Oyster sauce. Cocktail sauce. Horseradish that you find on the shelf. Ketchup. Mustard. Meat flavorings and tenderizers. Bouillon cubes. Hot sauce and Tabasco sauce. Premade or packaged marinades. Premade or packaged taco seasonings. Relishes. Regular salad dressings. Where to find more information:  National Heart, Lung, and Blood Institute: www.nhlbi.nih.gov  American Heart Association: www.heart.org Summary  The DASH eating plan is a healthy eating plan that has been shown to reduce high blood pressure (hypertension). It may also reduce your risk for type 2 diabetes, heart disease, and stroke.  With the DASH eating plan, you should limit salt (sodium) intake to 2,300 mg a day. If you have hypertension, you may need to reduce your sodium intake to 1,500 mg a day.  When on the DASH eating plan, aim to eat more fresh fruits and vegetables, whole grains, lean proteins, low-fat dairy, and heart-healthy fats.  Work with your health care provider or diet and nutrition specialist (dietitian) to adjust your eating plan to your individual   calorie needs. This information is not intended to replace advice given to you by your health care provider. Make sure you discuss any questions you have with your health care provider. Document Released: 03/10/2011 Document Revised: 03/14/2016 Document Reviewed: 03/14/2016 Elsevier Interactive Patient Education  2018 Elsevier Inc.  

## 2017-06-27 NOTE — Progress Notes (Signed)
Subjective:    Patient ID: Gail Lopez, female    DOB: 12/09/1960, 57 y.o.   MRN: [id number]  HPI Here to establish care [place] Works as LawyerCNA at [employer] [place]  Recently saw gyn-- Dr Marcelle OverlieHolland Had pap and mammogram Recent labs looked okay other than low potassium and somewhat high cholesterol  Has some anxiety Will get "waves of anxiety"---had xanax in the past (and just got refill) She will try 1/2 Works 3rd shift--so some sleeping problems (xanax helps then) Has tried melatonin -- not sure if any help  No regular exercise Fx of ankle last June (left)---still some trouble with weight bearing Weight up 20# in that time  Current Outpatient Medications on File Prior to Visit  Medication Sig Dispense Refill  . ALPRAZolam (XANAX) 1 MG tablet Take 1 mg by mouth at bedtime as needed.     No current facility-administered medications on file prior to visit.     Allergies  Allergen Reactions  . Percocet [Oxycodone-Acetaminophen] Nausea And Vomiting    Past Medical History:  Diagnosis Date  . Anxiety     Past Surgical History:  Procedure Laterality Date  . ABLATION     endometrial  . CESAREAN SECTION     two    Family History  Problem Relation Age of Onset  . Diabetes Mother   . Hyperlipidemia Mother   . Hypertension Mother   . Heart attack Father   . Stroke Father   . Lung cancer Father        cured  . Ulcerative colitis Daughter   . Sudden death Neg Hx     Social History   Socioeconomic History  . Marital status: Married    Spouse name: Not on file  . Number of children: 2  . Years of education: Not on file  . Highest education level: Not on file  Occupational History  . Occupation: CNA    CommentLexicographer: [place] [employer]  Social Needs  . Financial resource strain: Not on file  . Food insecurity:    Worry: Not on file    Inability: Not on file  . Transportation needs:    Medical: Not on  file    Non-medical: Not on file  Tobacco Use  . Smoking status: Never Smoker  . Smokeless tobacco: Never Used  Substance and Sexual Activity  . Alcohol use: Yes    Comment: socially  . Drug use: No  . Sexual activity: Not on file  Lifestyle  . Physical activity:    Days per week: Not on file    Minutes per session: Not on file  . Stress: Not on file  Relationships  . Social connections:    Talks on phone: Not on file    Gets together: Not on file    Attends religious service: Not on file    Active member of club or organization: Not on file    Attends meetings of clubs or organizations: Not on file    Relationship status: Not on file  . Intimate partner violence:    Fear of current or ex partner: Not on file    Emotionally abused: Not on file    Physically abused: Not on file    Forced sexual activity: Not on file  Other Topics Concern  . Not on file  Social History Narrative  . Not on file   Review of Systems  Constitutional: Positive for unexpected  weight change. Negative for fatigue.       Wears seat belt  HENT: Negative for dental problem, hearing loss and tinnitus.        Keeps up with dentist  Eyes: Negative for visual disturbance.       No diplopia or unilateral vision loss  Respiratory: Negative for cough, chest tightness and shortness of breath.   Cardiovascular: Negative for chest pain and leg swelling.       Racing heart just with the anxiety  Gastrointestinal: Negative for blood in stool and constipation.       No heartburn   Endocrine: Negative for polydipsia and polyuria.  Genitourinary: Negative for dyspareunia, dysuria and hematuria.  Musculoskeletal:       Some back and hip pain--- no meds  Skin: Negative for rash.       No suspicious lesion  Allergic/Immunologic: Negative for environmental allergies and immunocompromised state.  Neurological: Negative for dizziness, syncope, light-headedness and headaches.  Hematological: Negative for  adenopathy. Bruises/bleeds easily.  Psychiatric/Behavioral: Positive for sleep disturbance. Negative for dysphoric mood. The patient is nervous/anxious.        Objective:   Physical Exam  Constitutional: She is oriented to person, place, and time. She appears well-developed. No distress.  HENT:  Head: Normocephalic and atraumatic.  Right Ear: External ear normal.  Left Ear: External ear normal.  Mouth/Throat: Oropharynx is clear and moist. No oropharyngeal exudate.  Eyes: Pupils are equal, round, and reactive to light. Conjunctivae are normal.  Neck: No thyromegaly present.  Cardiovascular: Normal rate, regular rhythm, normal heart sounds and intact distal pulses. Exam reveals no gallop.  No murmur heard. Pulmonary/Chest: Effort normal and breath sounds normal. No respiratory distress. She has no wheezes. She has no rales.  Abdominal: Soft. There is no tenderness.  Musculoskeletal: She exhibits no edema or tenderness.  Lymphadenopathy:    She has no cervical adenopathy.  Neurological: She is alert and oriented to person, place, and time.  Skin: No rash noted. No erythema.  Psychiatric: She has a normal mood and affect. Her behavior is normal.          Assessment & Plan:

## 2017-06-27 NOTE — Assessment & Plan Note (Signed)
Just had pap, mammo and blood work (she will have gyn send records) Will do FIT Tdap today Yearly flu vaccine Don't recommend phentermine----DASH and resume regular exercise

## 2017-07-06 ENCOUNTER — Encounter: Payer: Self-pay | Admitting: Internal Medicine

## 2018-05-05 DIAGNOSIS — S62109A Fracture of unspecified carpal bone, unspecified wrist, initial encounter for closed fracture: Secondary | ICD-10-CM

## 2018-05-05 HISTORY — DX: Fracture of unspecified carpal bone, unspecified wrist, initial encounter for closed fracture: S62.109A

## 2018-05-06 ENCOUNTER — Emergency Department
Admission: EM | Admit: 2018-05-06 | Discharge: 2018-05-06 | Disposition: A | Discharge status: Home / Self Care | Payer: BLUE CROSS/BLUE SHIELD | Attending: Emergency Medicine | Admitting: Emergency Medicine

## 2018-05-06 ENCOUNTER — Other Ambulatory Visit: Payer: Self-pay

## 2018-05-06 ENCOUNTER — Emergency Department: Payer: BLUE CROSS/BLUE SHIELD

## 2018-05-06 ENCOUNTER — Encounter: Payer: Self-pay | Admitting: Emergency Medicine

## 2018-05-06 DIAGNOSIS — Y999 Unspecified external cause status: Secondary | ICD-10-CM | POA: Diagnosis not present

## 2018-05-06 DIAGNOSIS — S59911A Unspecified injury of right forearm, initial encounter: Secondary | ICD-10-CM | POA: Diagnosis present

## 2018-05-06 DIAGNOSIS — W102XXA Fall (on)(from) incline, initial encounter: Secondary | ICD-10-CM | POA: Diagnosis not present

## 2018-05-06 DIAGNOSIS — Y929 Unspecified place or not applicable: Secondary | ICD-10-CM | POA: Insufficient documentation

## 2018-05-06 DIAGNOSIS — Y9301 Activity, walking, marching and hiking: Secondary | ICD-10-CM | POA: Diagnosis not present

## 2018-05-06 DIAGNOSIS — S52531A Colles' fracture of right radius, initial encounter for closed fracture: Secondary | ICD-10-CM | POA: Insufficient documentation

## 2018-05-06 MED ORDER — ONDANSETRON 4 MG PO TBDP
4.0000 mg | ORAL_TABLET | Freq: Once | ORAL | Status: AC
  Administered 2018-05-06: 4 mg via ORAL
  Filled 2018-05-06: qty 1

## 2018-05-06 MED ORDER — OXYCODONE HCL 5 MG PO TABS
5.0000 mg | ORAL_TABLET | Freq: Once | ORAL | Status: AC
  Administered 2018-05-06: 5 mg via ORAL
  Filled 2018-05-06: qty 1

## 2018-05-06 MED ORDER — HYDROCODONE-ACETAMINOPHEN 5-325 MG PO TABS: 1.0000 | ORAL_TABLET | Freq: Four times a day (QID) | ORAL | 0 refills | Status: DC | PRN

## 2018-05-06 MED ORDER — ONDANSETRON HCL 4 MG PO TABS: 4.0000 mg | ORAL_TABLET | Freq: Three times a day (TID) | ORAL | 0 refills | Status: AC | PRN

## 2018-05-06 NOTE — ED Provider Notes (Signed)
[facility] Emergency Department Provider Note ____________________________________________  Time seen: Approximately 3:57 PM  I have reviewed the triage vital signs and the nursing notes.   HISTORY  Chief Complaint Fall and Wrist Pain    HPI Gail Lopez is a 58 y.o. female who presents to the emergency department for evaluation and treatment of right wrist pain after a mechanical, non-syncopal fall while walking down a wheelchair ramp this morning at about 4 AM.  She is right-hand dominant.  She took a hydrocodone about 430 that has provided some relief.  Past Medical History:  Diagnosis Date  . Anxiety   . Hypercholesteremia     Patient Active Problem List   Diagnosis Date Noted  . Preventative health care 06/27/2017  . Right knee pain 11/06/2012    Past Surgical History:  Procedure Laterality Date  . ABLATION     endometrial  . ANKLE SURGERY    . CESAREAN SECTION     two    Prior to Admission medications   Medication Sig Start Date End Date Taking? Authorizing Provider  ALPRAZolam Prudy Feeler(XANAX) 1 MG tablet Take 1 mg by mouth at bedtime as needed.    [provider]  HYDROcodone-acetaminophen (NORCO/VICODIN) 5-325 MG tablet Take 1 tablet by mouth every 6 (six) hours as needed for moderate pain. 05/06/18 05/06/19  Rielly Brunn, Rulon Eisenmengerari B, FNP  ondansetron (ZOFRAN) 4 MG tablet Take 1 tablet (4 mg total) by mouth every 8 (eight) hours as needed for nausea or vomiting. 05/06/18   Chinita Pesterriplett, Skie Vitrano B, FNP    Allergies Percocet [oxycodone-acetaminophen]  Family History  Problem Relation Age of Onset  . Diabetes Mother   . Hyperlipidemia Mother   . Hypertension Mother   . Heart attack Father   . Stroke Father   . Lung cancer Father        cured  . Ulcerative colitis Daughter   . Sudden death Neg Hx     Social History Social History   Tobacco Use  . Smoking status: Never Smoker  . Smokeless tobacco: Never Used  Substance Use Topics  . Alcohol  use: Yes    Comment: socially  . Drug use: No    Review of Systems Constitutional: Negative for fever. Cardiovascular: Negative for chest pain. Respiratory: Negative for shortness of breath. Musculoskeletal: Positive for right wrist pain. Skin: Negative for open wounds Neurological: Negative for decrease in sensation  ____________________________________________   PHYSICAL EXAM:  VITAL SIGNS: ED Triage Vitals  Enc Vitals Group     BP 05/06/18 0510 138/65     Pulse Rate 05/06/18 0510 65     Resp 05/06/18 0510 18     Temp 05/06/18 0510 97.6 F (36.4 C)     Temp Source 05/06/18 0510 Oral     SpO2 05/06/18 0510 99 %     Weight 05/06/18 0507 176 lb (79.8 kg)     Height 05/06/18 0507 5\' 2"  (1.575 m)     Head Circumference --      Peak Flow --      Pain Score 05/06/18 0507 4     Pain Loc --      Pain Edu? --      Excl. in GC? --     Constitutional: Alert and oriented. Well appearing and in no acute distress. Eyes: Conjunctivae are clear without discharge or drainage Head: Atraumatic Neck: Supple Respiratory: No cough. Respirations are even and unlabored. Musculoskeletal: Right wrist deformity is noted.  No tenderness over  the olecranon or shoulder.  No tenderness over the humerus Neurologic: Motor and sensory function of the right hand and arm is intact Skin: Swelling diffusely over the right wrist Psychiatric: Affect and behavior are appropriate.  ____________________________________________   LABS (all labs ordered are listed, but only abnormal results are displayed)  Labs Reviewed - No data to display ____________________________________________  RADIOLOGY  Image of the right wrist shows a Colles' fracture ____________________________________________   PROCEDURES  .Splint Application Date/Time: 05/06/2018 3:59 PM Performed by: Chinita Pesterriplett, Mikyle Sox B, FNP Authorized by: Chinita Pesterriplett, Shahzaib Azevedo B, FNP   Consent:    Consent obtained:  Verbal   Consent given by:   Patient Pre-procedure details:    Sensation:  Normal Procedure details:    Laterality:  Right   Location:  Wrist   Splint type:  Volar short arm   Supplies:  Cotton padding, elastic bandage and Ortho-Glass    ____________________________________________   INITIAL IMPRESSION / ASSESSMENT AND PLAN / ED COURSE  Gail Lopez is a 58 y.o. who presents to the emergency department for treatment and evaluation of right wrist pain after mechanical, non-syncopal fall.  X-ray shows a Colles' fracture.  While here, she was placed in a volar OCL and given oxycodone.  Patient instructed to follow-up with orthopedics this week.  She was also instructed to return to the emergency department for symptoms that change or worsen if unable schedule an appointment with orthopedics or primary care.  Medications  oxyCODONE (Oxy IR/ROXICODONE) immediate release tablet 5 mg (5 mg Oral Given 05/06/18 0747)  ondansetron (ZOFRAN-ODT) disintegrating tablet 4 mg (4 mg Oral Given 05/06/18 0747)    Pertinent labs & imaging results that were available during my care of the patient were reviewed by me and considered in my medical decision making (see chart for details).  _________________________________________   FINAL CLINICAL IMPRESSION(S) / ED DIAGNOSES  Final diagnoses:  Closed Colles' fracture of right radius, initial encounter    ED Discharge Orders         Ordered    HYDROcodone-acetaminophen (NORCO/VICODIN) 5-325 MG tablet  Every 6 hours PRN     05/06/18 0801    ondansetron (ZOFRAN) 4 MG tablet  Every 8 hours PRN     05/06/18 0801           If controlled substance prescribed during this visit, 12 month history viewed on the [facility] prior to issuing an initial prescription for Schedule II or III opiod.    Chinita Pesterriplett, Hubbard Seldon B, FNP 05/06/18 1600    Myrna BlazerSchaevitz, David Matthew, MD 05/06/18 [id number]

## 2018-05-06 NOTE — Discharge Instructions (Signed)
Please call and schedule an appointment with orthopedics.  Apply ice bag over cast on and off throughout the next couple of days.  Return to the ER for any symptom of concern if unable to see primary care or the orthopedist.

## 2018-05-06 NOTE — ED Notes (Signed)
Patient transported to X-ray 

## 2018-05-06 NOTE — ED Notes (Signed)
Pt states she slipped and fell around 0400 this morning, states she took a hydrocodone around 0430. Pt rates pain 6/10 currently.

## 2018-05-06 NOTE — ED Triage Notes (Signed)
Patient states that she slipped and fell. Patient with pain to right wrist.

## 2018-05-09 ENCOUNTER — Encounter (HOSPITAL_BASED_OUTPATIENT_CLINIC_OR_DEPARTMENT_OTHER): Payer: Self-pay

## 2018-05-09 ENCOUNTER — Other Ambulatory Visit: Payer: Self-pay

## 2018-05-09 ENCOUNTER — Telehealth: Payer: Self-pay

## 2018-05-09 NOTE — Telephone Encounter (Signed)
Spoke to pt. She is having surgery Tuesday for the fracture.

## 2018-05-09 NOTE — Progress Notes (Signed)
Spoke with: Lawson Fiscal NPO:  After Midnight, no gum, candy, or mints  Arrival time: 0945AM Labs: N/A AM medications: None Pre op orders: Yes Ride home:  Pryor Montes (sister) [phone]

## 2018-05-14 NOTE — H&P (Signed)
[facility]  [address]   [address] [place] [id number] [phone]   A Division of [facility]  RE: Gail Lopez, Gail Lopez   [id number]      DOB: 05/22/1960  05-09-2018  REASON FOR VISIT: Referral from the emergency room. Fell on right wrist on 05-06-18.   HPI:   She is a pleasant 58 year old woman who slipped on a wet ramp and fell, suffering a distal radius fracture. The pain has been well controlled. She has been in a splint.  EXAMINATION: Well appearing female in no apparent distress. The right upper extremity is neurovascularly intact. Swelling is controlled.   IMAGES: X-rays reviewed by me:   [facility] dorsally angulated distal radius fracture with intra-articular involvement of the three pieces.   ASSESSMENT/PLAN: I would recommend open reduction and internal fixation of this fracture given the articular involvement and displacement. We will set this hope for hopefully this week.    Jewel Baizeimothy D.  Eulah PontMurphy, M.D.  Electronically verified by Jewel Baizeimothy D. Eulah PontMurphy, M.D. TDMNorman Herrlich: jgc D:  05-11-18 T:   05-14-18

## 2018-05-15 ENCOUNTER — Ambulatory Visit (HOSPITAL_BASED_OUTPATIENT_CLINIC_OR_DEPARTMENT_OTHER): Payer: BLUE CROSS/BLUE SHIELD | Admitting: Anesthesiology

## 2018-05-15 ENCOUNTER — Encounter (HOSPITAL_BASED_OUTPATIENT_CLINIC_OR_DEPARTMENT_OTHER)
Admission: RE | Disposition: A | Discharge status: Home / Self Care | Payer: Self-pay | Source: Home / Self Care | Attending: Orthopedic Surgery

## 2018-05-15 ENCOUNTER — Other Ambulatory Visit: Payer: Self-pay

## 2018-05-15 ENCOUNTER — Encounter (HOSPITAL_BASED_OUTPATIENT_CLINIC_OR_DEPARTMENT_OTHER): Payer: Self-pay | Admitting: Certified Registered Nurse Anesthetist

## 2018-05-15 ENCOUNTER — Ambulatory Visit (HOSPITAL_BASED_OUTPATIENT_CLINIC_OR_DEPARTMENT_OTHER)
Admission: RE | Admit: 2018-05-15 | Discharge: 2018-05-15 | Disposition: A | Discharge status: Home / Self Care | Payer: BLUE CROSS/BLUE SHIELD | Attending: Orthopedic Surgery | Admitting: Orthopedic Surgery

## 2018-05-15 DIAGNOSIS — W010XXA Fall on same level from slipping, tripping and stumbling without subsequent striking against object, initial encounter: Secondary | ICD-10-CM | POA: Diagnosis not present

## 2018-05-15 DIAGNOSIS — F419 Anxiety disorder, unspecified: Secondary | ICD-10-CM | POA: Diagnosis not present

## 2018-05-15 DIAGNOSIS — S52571A Other intraarticular fracture of lower end of right radius, initial encounter for closed fracture: Secondary | ICD-10-CM | POA: Diagnosis present

## 2018-05-15 DIAGNOSIS — S62101A Fracture of unspecified carpal bone, right wrist, initial encounter for closed fracture: Secondary | ICD-10-CM

## 2018-05-15 HISTORY — PX: ORIF RADIAL FRACTURE: SHX5113

## 2018-05-15 HISTORY — DX: Vitamin D deficiency, unspecified: E55.9

## 2018-05-15 HISTORY — DX: Urinary tract infection, site not specified: N39.0

## 2018-05-15 HISTORY — DX: Fracture of unspecified carpal bone, unspecified wrist, initial encounter for closed fracture: S62.109A

## 2018-05-15 HISTORY — DX: Presence of spectacles and contact lenses: Z97.3

## 2018-05-15 HISTORY — DX: Personal history of diseases of the blood and blood-forming organs and certain disorders involving the immune mechanism: Z86.2

## 2018-05-15 HISTORY — DX: Low back pain, unspecified: M54.50

## 2018-05-15 HISTORY — DX: Low back pain: M54.5

## 2018-05-15 SURGERY — OPEN REDUCTION INTERNAL FIXATION (ORIF) RADIAL FRACTURE
Anesthesia: General | Laterality: Right

## 2018-05-15 MED ORDER — LACTATED RINGERS IV SOLN
INTRAVENOUS | Status: DC
  Administered 2018-05-15: 11:00:00 via INTRAVENOUS
  Filled 2018-05-15: qty 1000

## 2018-05-15 MED ORDER — DEXAMETHASONE SODIUM PHOSPHATE 10 MG/ML IJ SOLN
INTRAMUSCULAR | Status: DC | PRN
  Administered 2018-05-15: 4 mg via INTRAVENOUS

## 2018-05-15 MED ORDER — ACETAMINOPHEN 500 MG PO TABS: 1000.0000 mg | ORAL_TABLET | Freq: Three times a day (TID) | ORAL | 0 refills | Status: AC

## 2018-05-15 MED ORDER — LIDOCAINE HCL (CARDIAC) PF 100 MG/5ML IV SOSY
PREFILLED_SYRINGE | INTRAVENOUS | Status: DC | PRN
  Administered 2018-05-15: 40 mg via INTRAVENOUS

## 2018-05-15 MED ORDER — FENTANYL CITRATE (PF) 100 MCG/2ML IJ SOLN
INTRAMUSCULAR | Status: AC
  Filled 2018-05-15: qty 2

## 2018-05-15 MED ORDER — CEFAZOLIN SODIUM-DEXTROSE 2-4 GM/100ML-% IV SOLN
2.0000 g | INTRAVENOUS | Status: AC
  Administered 2018-05-15: 2 g via INTRAVENOUS
  Filled 2018-05-15: qty 100

## 2018-05-15 MED ORDER — ACETAMINOPHEN 500 MG PO TABS
ORAL_TABLET | ORAL | Status: AC
  Filled 2018-05-15: qty 2

## 2018-05-15 MED ORDER — ONDANSETRON HCL 4 MG/2ML IJ SOLN
INTRAMUSCULAR | Status: DC | PRN
  Administered 2018-05-15: 4 mg via INTRAVENOUS

## 2018-05-15 MED ORDER — OXYCODONE HCL 5 MG PO TABS: 5.0000 mg | ORAL_TABLET | ORAL | 0 refills | Status: AC | PRN

## 2018-05-15 MED ORDER — OXYCODONE HCL 5 MG/5ML PO SOLN
5.0000 mg | Freq: Once | ORAL | Status: DC | PRN
  Filled 2018-05-15: qty 5

## 2018-05-15 MED ORDER — CEFAZOLIN SODIUM-DEXTROSE 2-4 GM/100ML-% IV SOLN
INTRAVENOUS | Status: AC
  Filled 2018-05-15: qty 100

## 2018-05-15 MED ORDER — CHLORHEXIDINE GLUCONATE 4 % EX LIQD
60.0000 mL | Freq: Once | CUTANEOUS | Status: DC
  Filled 2018-05-15: qty 118

## 2018-05-15 MED ORDER — ACETAMINOPHEN 500 MG PO TABS
1000.0000 mg | ORAL_TABLET | Freq: Once | ORAL | Status: AC
  Administered 2018-05-15: 1000 mg via ORAL
  Filled 2018-05-15: qty 2

## 2018-05-15 MED ORDER — BUPIVACAINE-EPINEPHRINE (PF) 0.5% -1:200000 IJ SOLN
INTRAMUSCULAR | Status: DC | PRN
  Administered 2018-05-15: 25 mL via PERINEURAL

## 2018-05-15 MED ORDER — OXYCODONE HCL 5 MG PO TABS
5.0000 mg | ORAL_TABLET | Freq: Once | ORAL | Status: DC | PRN
  Filled 2018-05-15: qty 1

## 2018-05-15 MED ORDER — DEXAMETHASONE SODIUM PHOSPHATE 10 MG/ML IJ SOLN
INTRAMUSCULAR | Status: AC
  Filled 2018-05-15: qty 1

## 2018-05-15 MED ORDER — FENTANYL CITRATE (PF) 100 MCG/2ML IJ SOLN
50.0000 ug | Freq: Once | INTRAMUSCULAR | Status: AC
  Administered 2018-05-15: 50 ug via INTRAVENOUS
  Filled 2018-05-15: qty 1

## 2018-05-15 MED ORDER — MIDAZOLAM HCL 2 MG/2ML IJ SOLN
INTRAMUSCULAR | Status: AC
  Filled 2018-05-15: qty 2

## 2018-05-15 MED ORDER — FENTANYL CITRATE (PF) 100 MCG/2ML IJ SOLN
INTRAMUSCULAR | Status: DC | PRN
  Administered 2018-05-15: 50 ug via INTRAVENOUS

## 2018-05-15 MED ORDER — FENTANYL CITRATE (PF) 100 MCG/2ML IJ SOLN
25.0000 ug | INTRAMUSCULAR | Status: DC | PRN
  Filled 2018-05-15: qty 1

## 2018-05-15 MED ORDER — GABAPENTIN 300 MG PO CAPS
ORAL_CAPSULE | ORAL | Status: AC
  Filled 2018-05-15: qty 1

## 2018-05-15 MED ORDER — LIDOCAINE 2% (20 MG/ML) 5 ML SYRINGE
INTRAMUSCULAR | Status: AC
  Filled 2018-05-15: qty 5

## 2018-05-15 MED ORDER — PROPOFOL 10 MG/ML IV BOLUS
INTRAVENOUS | Status: DC | PRN
  Administered 2018-05-15: 150 mg via INTRAVENOUS

## 2018-05-15 MED ORDER — MIDAZOLAM HCL 2 MG/2ML IJ SOLN
1.0000 mg | Freq: Once | INTRAMUSCULAR | Status: AC
  Administered 2018-05-15: 1 mg via INTRAVENOUS
  Filled 2018-05-15: qty 1

## 2018-05-15 MED ORDER — ACETAMINOPHEN 10 MG/ML IV SOLN
1000.0000 mg | Freq: Once | INTRAVENOUS | Status: DC | PRN
  Filled 2018-05-15: qty 100

## 2018-05-15 MED ORDER — PROMETHAZINE HCL 25 MG/ML IJ SOLN
6.2500 mg | INTRAMUSCULAR | Status: DC | PRN
  Filled 2018-05-15: qty 1

## 2018-05-15 MED ORDER — GABAPENTIN 300 MG PO CAPS
300.0000 mg | ORAL_CAPSULE | Freq: Once | ORAL | Status: AC
  Administered 2018-05-15: 300 mg via ORAL
  Filled 2018-05-15: qty 1

## 2018-05-15 MED ORDER — ONDANSETRON HCL 4 MG/2ML IJ SOLN
INTRAMUSCULAR | Status: AC
  Filled 2018-05-15: qty 2

## 2018-05-15 SURGICAL SUPPLY — 79 items
BANDAGE ACE 4X5 VEL STRL LF (GAUZE/BANDAGES/DRESSINGS) ×2 IMPLANT
BANDAGE ELASTIC 4 VELCRO ST LF (GAUZE/BANDAGES/DRESSINGS) ×2 IMPLANT
BIT DRILL 2.2 SS TIBIAL (BIT) ×2 IMPLANT
BLADE CLIPPER SURG (BLADE) IMPLANT
BLADE SURG 15 STRL LF DISP TIS (BLADE) ×1 IMPLANT
BLADE SURG 15 STRL SS (BLADE) ×1
BNDG COHESIVE 4X5 TAN STRL (GAUZE/BANDAGES/DRESSINGS) ×2 IMPLANT
BNDG ESMARK 4X9 LF (GAUZE/BANDAGES/DRESSINGS) ×2 IMPLANT
CHLORAPREP W/TINT 26ML (MISCELLANEOUS) ×2 IMPLANT
CLSR STERI-STRIP ANTIMIC 1/2X4 (GAUZE/BANDAGES/DRESSINGS) IMPLANT
CORD BIPOLAR FORCEPS 12FT (ELECTRODE) ×2 IMPLANT
COVER BACK TABLE 60X90IN (DRAPES) IMPLANT
COVER WAND RF STERILE (DRAPES) IMPLANT
CUFF TOURNIQUET SINGLE 18IN (TOURNIQUET CUFF) ×2 IMPLANT
CUFF TOURNIQUET SINGLE 24IN (TOURNIQUET CUFF) IMPLANT
DRAPE EXTREMITY T 121X128X90 (DISPOSABLE) IMPLANT
DRAPE IMP U-DRAPE 54X76 (DRAPES) ×2 IMPLANT
DRAPE OEC MINIVIEW 54X84 (DRAPES) ×2 IMPLANT
DRAPE U-SHAPE 47X51 STRL (DRAPES) ×2 IMPLANT
DRAPE U-SHAPE 76X120 STRL (DRAPES) IMPLANT
DRSG EMULSION OIL 3X3 NADH (GAUZE/BANDAGES/DRESSINGS) ×2 IMPLANT
ELECT REM PT RETURN 9FT ADLT (ELECTROSURGICAL) ×2
ELECTRODE REM PT RTRN 9FT ADLT (ELECTROSURGICAL) ×1 IMPLANT
GAUZE SPONGE 4X4 12PLY STRL (GAUZE/BANDAGES/DRESSINGS) ×2 IMPLANT
GAUZE XEROFORM 1X8 LF (GAUZE/BANDAGES/DRESSINGS) ×2 IMPLANT
GLOVE BIO SURGEON STRL SZ7.5 (GLOVE) ×4 IMPLANT
GLOVE BIOGEL PI IND STRL 8 (GLOVE) ×2 IMPLANT
GLOVE BIOGEL PI INDICATOR 8 (GLOVE) ×2
GOWN STRL REUS W/TWL XL LVL3 (GOWN DISPOSABLE) ×4 IMPLANT
K-WIRE 1.6 (WIRE) ×2
K-WIRE FX5X1.6XNS BN SS (WIRE) ×2
KWIRE FX5X1.6XNS BN SS (WIRE) ×2 IMPLANT
NEEDLE HYPO 25X1 1.5 SAFETY (NEEDLE) IMPLANT
NS IRRIG 1000ML POUR BTL (IV SOLUTION) ×2 IMPLANT
PACK ARTHROSCOPY DSU (CUSTOM PROCEDURE TRAY) ×2 IMPLANT
PACK BASIN DAY SURGERY FS (CUSTOM PROCEDURE TRAY) ×2 IMPLANT
PAD CAST 4YDX4 CTTN HI CHSV (CAST SUPPLIES) ×1 IMPLANT
PADDING CAST ABS 4INX4YD NS (CAST SUPPLIES) ×1
PADDING CAST ABS COTTON 4X4 ST (CAST SUPPLIES) ×1 IMPLANT
PADDING CAST COTTON 4X4 STRL (CAST SUPPLIES) ×1
PASSER SUT SWANSON 36MM LOOP (INSTRUMENTS) IMPLANT
PEG LOCKING SMOOTH 2.2X18 (Peg) ×8 IMPLANT
PEG LOCKING SMOOTH 2.2X20 (Screw) ×6 IMPLANT
PENCIL BUTTON HOLSTER BLD 10FT (ELECTRODE) ×2 IMPLANT
PLATE DVR CROSSLOCK STD RT (Plate) ×2 IMPLANT
SCREW  LP NL 2.7X13MM (Screw) ×2 IMPLANT
SCREW 2.7X12MM (Screw) ×2 IMPLANT
SCREW LP NL 2.7X13MM (Screw) ×2 IMPLANT
SLING ARM FOAM STRAP LRG (SOFTGOODS) ×2 IMPLANT
SLING ARM FOAM STRAP MED (SOFTGOODS) ×2 IMPLANT
SLING ARM IMMOBILIZER LRG (SOFTGOODS) IMPLANT
SLING ARM IMMOBILIZER MED (SOFTGOODS) IMPLANT
SLING ARM MED ADULT FOAM STRAP (SOFTGOODS) IMPLANT
SLING ARM XL FOAM STRAP (SOFTGOODS) ×4 IMPLANT
SPLINT FAST PLASTER 5X30 (CAST SUPPLIES) ×10
SPLINT PLASTER CAST FAST 5X30 (CAST SUPPLIES) ×10 IMPLANT
SPONGE LAP 18X18 RF (DISPOSABLE) ×4 IMPLANT
STAPLER VISISTAT 35W (STAPLE) IMPLANT
STRIP CLOSURE SKIN 1/2X4 (GAUZE/BANDAGES/DRESSINGS) ×2 IMPLANT
SUCTION FRAZIER HANDLE 10FR (MISCELLANEOUS) ×1
SUCTION TUBE FRAZIER 10FR DISP (MISCELLANEOUS) ×1 IMPLANT
SUT ETHILON 3 0 PS 1 (SUTURE) IMPLANT
SUT FIBERWIRE #2 38 T-5 BLUE (SUTURE)
SUT MNCRL AB 4-0 PS2 18 (SUTURE) ×2 IMPLANT
SUT PROLENE 3 0 PS 2 (SUTURE) IMPLANT
SUT VIC AB 0 CT1 27 (SUTURE) ×1
SUT VIC AB 0 CT1 27XBRD ANBCTR (SUTURE) ×1 IMPLANT
SUT VIC AB 0 SH 27 (SUTURE) ×2 IMPLANT
SUT VIC AB 2-0 SH 27 (SUTURE) ×1
SUT VIC AB 2-0 SH 27XBRD (SUTURE) ×1 IMPLANT
SUT VIC AB 3-0 SH 27 (SUTURE)
SUT VIC AB 3-0 SH 27X BRD (SUTURE) IMPLANT
SUTURE FIBERWR #2 38 T-5 BLUE (SUTURE) IMPLANT
SYR BULB 3OZ (MISCELLANEOUS) ×2 IMPLANT
SYR CONTROL 10ML LL (SYRINGE) IMPLANT
TOWEL OR 17X26 10 PK STRL BLUE (TOWEL DISPOSABLE) ×2 IMPLANT
TUBE CONNECTING 12X1/4 (SUCTIONS) ×2 IMPLANT
UNDERPAD 30X30 (UNDERPADS AND DIAPERS) ×2 IMPLANT
YANKAUER SUCT BULB TIP NO VENT (SUCTIONS) ×2 IMPLANT

## 2018-05-15 NOTE — Discharge Instructions (Signed)
Keep wrist elevated with ice as much as possible to reduce pain and swelling. °If needed, you may increase pain medication for the first few days post op to 2 tablets every 4 hours.  Stop as needed pain medication as soon as you are able. ° °Diet: As you were doing prior to hospitalization  ° °Shower:  You have a splint on, leave the splint in place and keep the splint dry with a plastic bag. ° °Dressing:  You have a splint - leave the splint in place and we will change your bandages during your first follow-up appointment.   ° °Activity:  Increase activity slowly as tolerated, but follow the weight bearing instructions below.  The rules on driving is that you can not be taking narcotics while you drive, and you must feel in control of the vehicle.   ° °Weight Bearing:  Non weight bearing affected wrist.  Sling for comfort.  ° °To prevent constipation: you may use a stool softener such as - ° °Colace (over the counter) 100 mg by mouth twice a day  °Drink plenty of fluids (prune juice may be helpful) and high fiber foods °Miralax (over the counter) for constipation as needed.   ° °Itching:  If you experience itching with your medications, try taking only a single pain pill, or even half a pain pill at a time.  You can also use benadryl over the counter for itching or also to help with sleep.  ° °Precautions:  If you experience chest pain or shortness of breath - call 911 immediately for transfer to the hospital emergency department!! ° °If you develop a fever greater that 101 F, purulent drainage from wound, increased redness or drainage from wound, or calf pain -- Call the office at [phone]                                        ° °Follow- Up Appointment:  Please call for an appointment to be seen in 2 weeks [place] - [phone] ° ° ° ° °Regional Anesthesia Blocks ° °1. Numbness or the inability to move the "blocked" extremity may last from 3-48 hours after placement. The length of time depends on the  medication injected and your individual response to the medication. If the numbness is not going away after 48 hours, call your surgeon. ° °2. The extremity that is blocked will need to be protected until the numbness is gone and the  Strength has returned. Because you cannot feel it, you will need to take extra care to avoid injury. Because it may be weak, you may have difficulty moving it or using it. You may not know what position it is in without looking at it while the block is in effect. ° °3. For blocks in the legs and feet, returning to weight bearing and walking needs to be done carefully. You will need to wait until the numbness is entirely gone and the strength has returned. You should be able to move your leg and foot normally before you try and bear weight or walk. You will need someone to be with you when you first try to ensure you do not fall and possibly risk injury. ° °4. Bruising and tenderness at the needle site are common side effects and will resolve in a few days. ° °5. Persistent numbness or new problems with movement should be communicated to   the surgeon or the [facility] ([phone])/ [facility] ([phone]). ° ° ° ° °Post Anesthesia Home Care Instructions ° °Activity: °Get plenty of rest for the remainder of the day. A responsible individual must stay with you for 24 hours following the procedure.  °For the next 24 hours, DO NOT: °-Drive a car °-Operate machinery °-Drink alcoholic beverages °-Take any medication unless instructed by your physician °-Make any legal decisions or sign important papers. ° °Meals: °Start with liquid foods such as gelatin or soup. Progress to regular foods as tolerated. Avoid greasy, spicy, heavy foods. If nausea and/or vomiting occur, drink only clear liquids until the nausea and/or vomiting subsides. Call your physician if vomiting continues. ° °Special Instructions/Symptoms: °Your throat may feel dry or sore from the anesthesia  or the breathing tube placed in your throat during surgery. If this causes discomfort, gargle with warm salt water. The discomfort should disappear within 24 hours. ° °If you had a scopolamine patch placed behind your ear for the management of post- operative nausea and/or vomiting: ° °1. The medication in the patch is effective for 72 hours, after which it should be removed.  Wrap patch in a tissue and discard in the trash. Wash hands thoroughly with soap and water. °2. You may remove the patch earlier than 72 hours if you experience unpleasant side effects which may include dry mouth, dizziness or visual disturbances. °3. Avoid touching the patch. Wash your hands with soap and water after contact with the patch. °  ° °

## 2018-05-15 NOTE — Anesthesia Postprocedure Evaluation (Signed)
Anesthesia Post Note  Patient: Marlicia Eskola Ballengee  Procedure(s) Performed: OPEN REDUCTION INTERNAL FIXATION (ORIF)DISTAL RADIAL INTRA-ARTICULAR FRACTURE 3 + FRAGMENTS (Right )     Patient location during evaluation: PACU Anesthesia Type: General Level of consciousness: awake and alert Pain management: pain level controlled Vital Signs Assessment: post-procedure vital signs reviewed and stable Respiratory status: spontaneous breathing, nonlabored ventilation and respiratory function stable Cardiovascular status: blood pressure returned to baseline and stable Postop Assessment: no apparent nausea or vomiting Anesthetic complications: no    Last Vitals:  Vitals:   05/15/18 1400 05/15/18 1430  BP: 120/70 140/80  Pulse: 64 74  Resp: 16 16  Temp:  36.8 C  SpO2: 92% 99%    Last Pain:  Vitals:   05/15/18 1430  TempSrc:   PainSc: 0-No pain                 Kaylyn Layer

## 2018-05-15 NOTE — Transfer of Care (Signed)
Immediate Anesthesia Transfer of Care Note  Patient: Gail Lopez  Procedure(s) Performed: OPEN REDUCTION INTERNAL FIXATION (ORIF)DISTAL RADIAL INTRA-ARTICULAR FRACTURE 3 + FRAGMENTS (Right )  Patient Location: PACU  Anesthesia Type:GA combined with regional for post-op pain  Level of Consciousness: awake, alert , oriented, drowsy and patient cooperative  Airway & Oxygen Therapy: Patient Spontanous Breathing and Patient connected to nasal cannula oxygen  Post-op Assessment: Report given to RN and Post -op Vital signs reviewed and stable  Post vital signs: Reviewed and stable  Last Vitals:  Vitals Value Taken Time  BP 125/76 05/15/2018 12:52 PM  Temp    Pulse 75 05/15/2018 12:56 PM  Resp 12 05/15/2018 12:56 PM  SpO2 96 % 05/15/2018 12:56 PM  Vitals shown include unvalidated device data.  Last Pain:  Vitals:   05/15/18 1013  TempSrc: Oral  PainSc: 2          Complications: No apparent anesthesia complications

## 2018-05-15 NOTE — Anesthesia Procedure Notes (Addendum)
Procedure Name: LMA Insertion Date/Time: 05/15/2018 11:48 AM Performed by: Yolonda Kidaarver, Shailene Demonbreun L, CRNA Pre-anesthesia Checklist: Patient identified, Emergency Drugs available, Suction available and Patient being monitored Patient Re-evaluated:Patient Re-evaluated prior to induction Oxygen Delivery Method: Circle system utilized Preoxygenation: Pre-oxygenation with 100% oxygen Induction Type: IV induction Ventilation: Mask ventilation without difficulty LMA: LMA inserted LMA Size: 4.0 Number of attempts: 1 Placement Confirmation: positive ETCO2,  CO2 detector and breath sounds checked- equal and bilateral Tube secured with: Tape Dental Injury: Teeth and Oropharynx as per pre-operative assessment

## 2018-05-15 NOTE — Interval H&P Note (Signed)
I participated in the care of this patient and agree with the above history, physical and evaluation. I performed a review of the history and a physical exam as detailed   Asuncion Shibata Daniel Jalayiah Bibian MD  

## 2018-05-15 NOTE — Anesthesia Preprocedure Evaluation (Addendum)
Anesthesia Evaluation  Patient identified by MRN, date of birth, ID band Patient awake    Reviewed: Allergy & Precautions, NPO status , Patient's Chart, lab work & pertinent test results  History of Anesthesia Complications Negative for: history of anesthetic complications  Airway Mallampati: II  TM Distance: >3 FB Neck ROM: Full    Dental  (+) Teeth Intact, Dental Advisory Given   Pulmonary neg pulmonary ROS,    Pulmonary exam normal breath sounds clear to auscultation       Cardiovascular negative cardio ROS Normal cardiovascular exam Rhythm:Regular Rate:Normal     Neuro/Psych Anxiety negative neurological ROS     GI/Hepatic negative GI ROS, Neg liver ROS,   Endo/Other  negative endocrine ROS  Renal/GU negative Renal ROS     Musculoskeletal Wrist fx   Abdominal   Peds  Hematology negative hematology ROS (+)   Anesthesia Other Findings Day of surgery medications reviewed with the patient.  Reproductive/Obstetrics                            Anesthesia Physical Anesthesia Plan  ASA: II  Anesthesia Plan: General   Post-op Pain Management: GA combined w/ Regional for post-op pain   Induction: Intravenous  PONV Risk Score and Plan: 3 and Treatment may vary due to age or medical condition, Ondansetron, Dexamethasone and Midazolam  Airway Management Planned: LMA  Additional Equipment:   Intra-op Plan:   Post-operative Plan: Extubation in OR  Informed Consent: I have reviewed the patients History and Physical, chart, labs and discussed the procedure including the risks, benefits and alternatives for the proposed anesthesia with the patient or authorized representative who has indicated his/her understanding and acceptance.     Dental advisory given  Plan Discussed with: CRNA  Anesthesia Plan Comments:        Anesthesia Quick Evaluation

## 2018-05-15 NOTE — Anesthesia Procedure Notes (Signed)
Anesthesia Regional Block: Supraclavicular block   Pre-Anesthetic Checklist: ,, timeout performed, Correct Patient, Correct Site, Correct Laterality, Correct Procedure, Correct Position, site marked, Risks and benefits discussed, pre-op evaluation,  At surgeon's request and post-op pain management  Laterality: Right  Prep: Maximum Sterile Barrier Precautions used, chloraprep       Needles:  Injection technique: Single-shot  Needle Type: Echogenic Stimulator Needle     Needle Length: 9cm  Needle Gauge: 22     Additional Needles:   Procedures:,,,, ultrasound used (permanent image in chart),,,,  Narrative:  Start time: 05/15/2018 10:42 AM End time: 05/15/2018 10:44 AM Injection made incrementally with aspirations every 5 mL.  Performed by: Personally  Anesthesiologist: Kaylyn Layer, MD  Additional Notes: Risks, benefits, and alternative discussed. Patient gave consent for procedure. Patient prepped and draped in sterile fashion. Sedation administered, patient remains easily responsive to voice. Relevant anatomy identified with ultrasound guidance. Local anesthetic given in 5cc increments with no signs or symptoms of intravascular injection. No pain or paraesthesias with injection. Patient monitored throughout procedure with signs of LAST or immediate complications. Tolerated well. Ultrasound image placed in chart.  Gail Greenhouse, MD

## 2018-05-15 NOTE — Progress Notes (Signed)
Time out performed with  Dr. Stephannie Peters correct patient, procedure, site,side surgeon verified. Patient tolerated supraclavicular block well.

## 2018-05-16 ENCOUNTER — Encounter (HOSPITAL_BASED_OUTPATIENT_CLINIC_OR_DEPARTMENT_OTHER): Payer: Self-pay | Admitting: Orthopedic Surgery

## 2018-05-17 NOTE — Op Note (Signed)
05/15/2018  7:29 AM  PATIENT:  Gail Lopez    PRE-OPERATIVE DIAGNOSIS:  FRACTURE OF THE LOWER END OF RIGHT RADIUS S52.501  POST-OPERATIVE DIAGNOSIS:  Same  PROCEDURE:  OPEN REDUCTION INTERNAL FIXATION (ORIF)DISTAL RADIAL INTRA-ARTICULAR FRACTURE 3 + FRAGMENTS  SURGEON:  Sheral Apley, MD  ASSISTANT: Aquilla Hacker, PA-C, he was present and scrubbed throughout the case, critical for completion in a timely fashion, and for retraction, instrumentation, and closure.   ANESTHESIA:   gen  PREOPERATIVE INDICATIONS:  Gail Lopez is a  58 y.o. female with a diagnosis of FRACTURE OF THE LOWER END OF RIGHT RADIUS S52.501 who failed conservative measures and elected for surgical management.    The risks benefits and alternatives were discussed with the patient preoperatively including but not limited to the risks of infection, bleeding, nerve injury, cardiopulmonary complications, the need for revision surgery, among others, and the patient was willing to proceed.  OPERATIVE IMPLANTS: DVR plate  OPERATIVE FINDINGS: unstable fx  BLOOD LOSS: min  COMPLICATIONS: none  TOURNIQUET TIME:  OPERATIVE PROCEDURE:  Patient was identified in the preoperative holding area and site was marked by me She was transported to the operating theater and placed on the table in supine position taking care to pad all bony prominences. After a preincinduction time out anesthesia was induced. The right upper extremity was prepped and draped in normal sterile fashion and a pre-incision timeout was performed. She received ancef for preoperative antibiotics.   I made a 5 cm incision centered over her FCR tendon and dissected down carefully to the level of the flexor tendon sheath and incise this longitudinally and retracted the FCR radially and incised the dorsal aspect of the sheath.   I bluntly dissected the FPL muscle belly away from the brachioradialis and then sharply incised the pronator tendon from  the distal radius and from the wrist capsule. I Elevated this off the bone the fractures visible.   I released the brachioradialis from its insertion. I then debrided the fracture and performed a manual reduction.   I selected a plate and I placed it on the bone. I pinned it into place and was happy on multiple radiographic views with it's placement. I then fixed the plate distally with the locking pegs. I confirmed no articular penetration with the pegs and that none were prominent dorsally.   I then reduced the plate to the shaft improving the volar and radial tilt of her distal radius.  I was happy with the final fluoro xrays    I thoroughly irrigated the wound and closed the pronator over top of the plate and then closed the skin in layers with absorbable stitch. Sterile dressing was applied using the PACU in stable condition.  POST OPERATIVE PLAN: NWB, Splint full time. Ambulate for DVT px.

## 2018-07-03 ENCOUNTER — Encounter: Payer: Managed Care, Other (non HMO) | Admitting: Internal Medicine

## 2018-07-20 ENCOUNTER — Telehealth: Payer: Self-pay

## 2018-07-20 NOTE — Telephone Encounter (Signed)
Tried to call pt to see about setting her up for a Virtual CPE. No answer and no vm.  She is not due for a Pap.

## 2018-07-23 ENCOUNTER — Telehealth: Payer: Self-pay

## 2018-07-23 NOTE — Telephone Encounter (Signed)
See previous note

## 2018-07-23 NOTE — Telephone Encounter (Signed)
Pt declined stating she will wait till august

## 2018-07-23 NOTE — Telephone Encounter (Signed)
[facility] Night - Client Nonclinical Telephone Record [facility] Client [facility] Night - Client Client Site [facility] - Night Physician Tillman Abide - MD Contact Type Call Who Is Calling Patient / Member / Family / Caregiver Caller Name Gail Lopez Caller Phone Number [phone] Call Type Message Only Information Provided Reason for Call Returning a Call from the Office Initial Comment Caller missed a call from office. Additional Comment Call Closed By: Caprice Kluver Transaction Date/Time: 07/20/2018 5:09:29 PM (ET)

## 2018-11-30 ENCOUNTER — Encounter: Payer: BC Managed Care – PPO | Admitting: Internal Medicine

## 2019-05-04 LAB — COLOGUARD: COLOGUARD: NEGATIVE

## 2019-05-04 LAB — EXTERNAL GENERIC LAB PROCEDURE: COLOGUARD: NEGATIVE

## 2020-01-06 ENCOUNTER — Ambulatory Visit: Payer: Self-pay | Attending: Internal Medicine

## 2020-01-06 DIAGNOSIS — Z23 Encounter for immunization: Secondary | ICD-10-CM

## 2020-01-06 NOTE — Progress Notes (Signed)
   Covid-19 Vaccination Clinic  Name:  JAONNA WORD    MRN: [id number] DOB: 07/11/1960  01/06/2020  Ms. Hush was observed post Covid-19 immunization for 15 minutes without incident. She was provided with Vaccine Information Sheet and instruction to access the V-Safe system.   Ms. Veale was instructed to call 911 with any severe reactions post vaccine: Marland Kitchen Difficulty breathing  . Swelling of face and throat  . A fast heartbeat  . A bad rash all over body  . Dizziness and weakness

## 2020-03-07 IMAGING — CR DG WRIST COMPLETE 3+V*R*
1 series · 4 of 4 positions shown · non-contrast
Comparison: None.

CLINICAL DATA: Status post fall, wrist pain

EXAM:
RIGHT WRIST - COMPLETE 3+ VIEW

[Series 1: dg wrist complete right · 0.14mm/px · 4 of 4 slices shown]
[im 1/4]
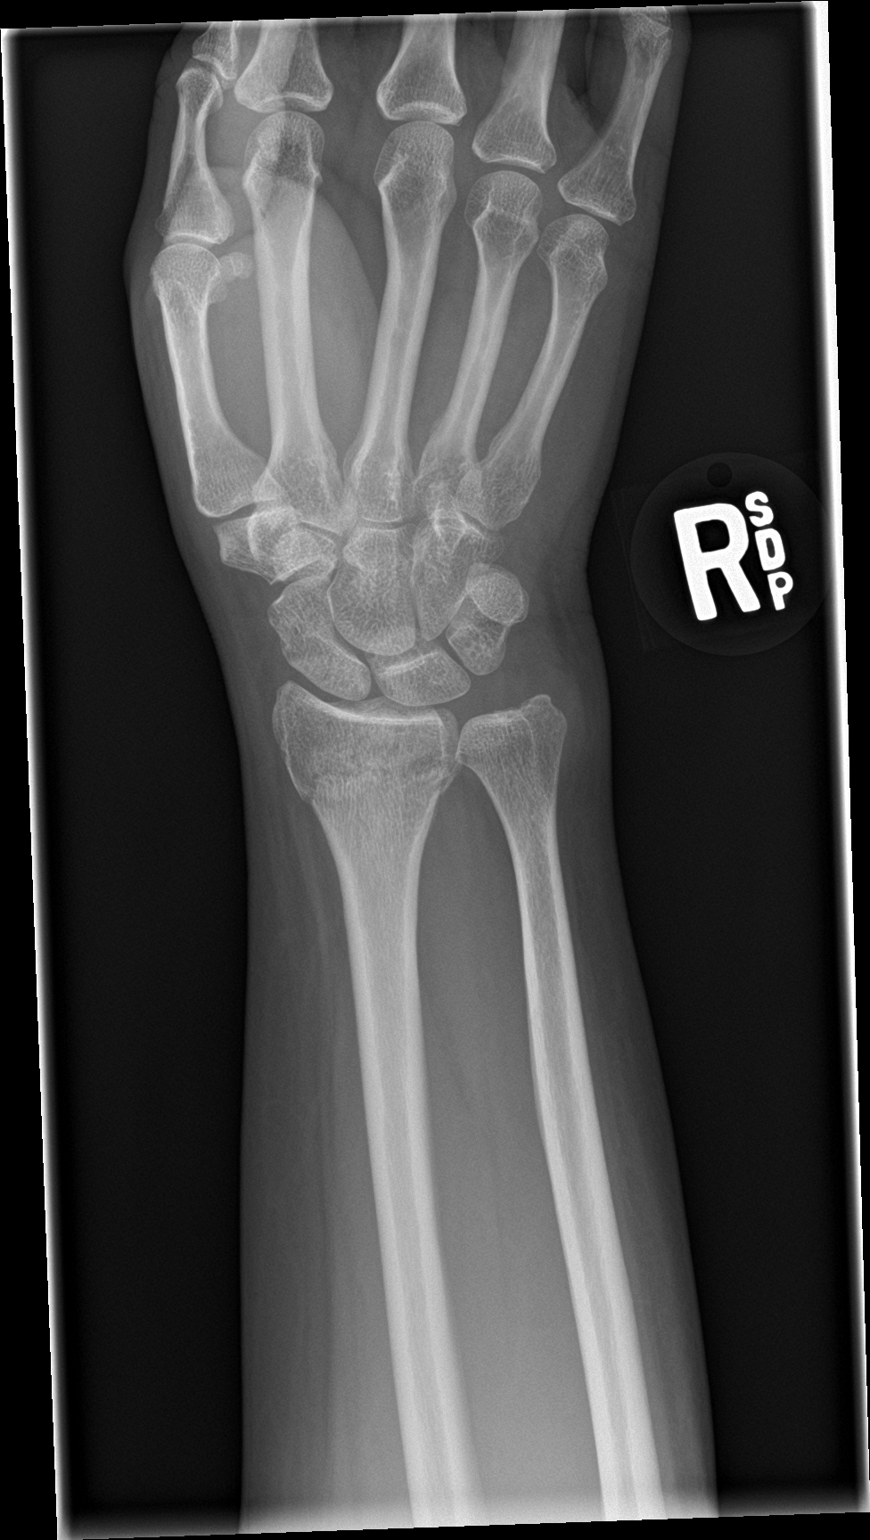
[im 2/4]
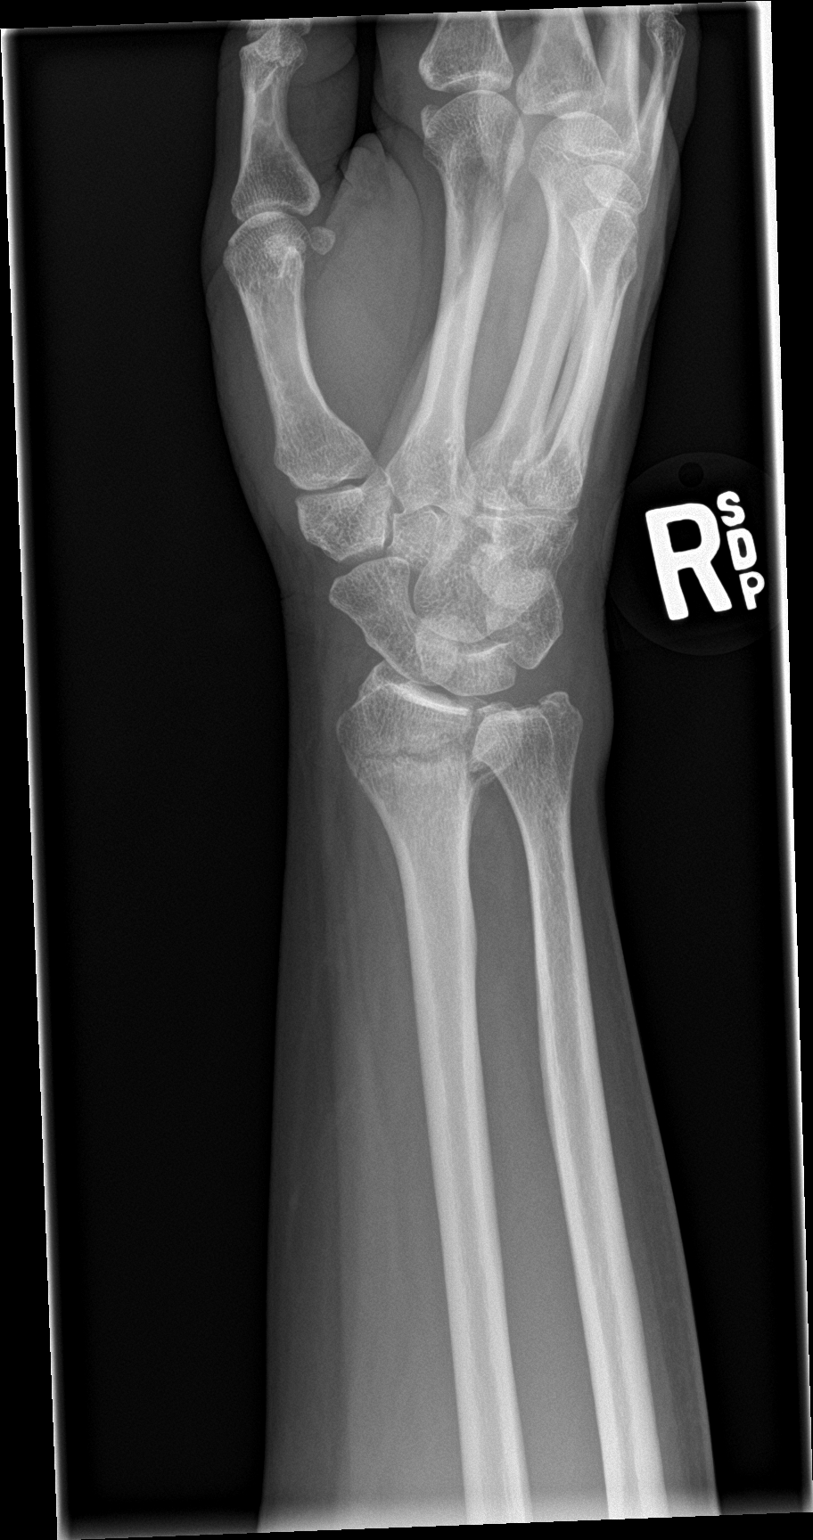
[im 3/4]
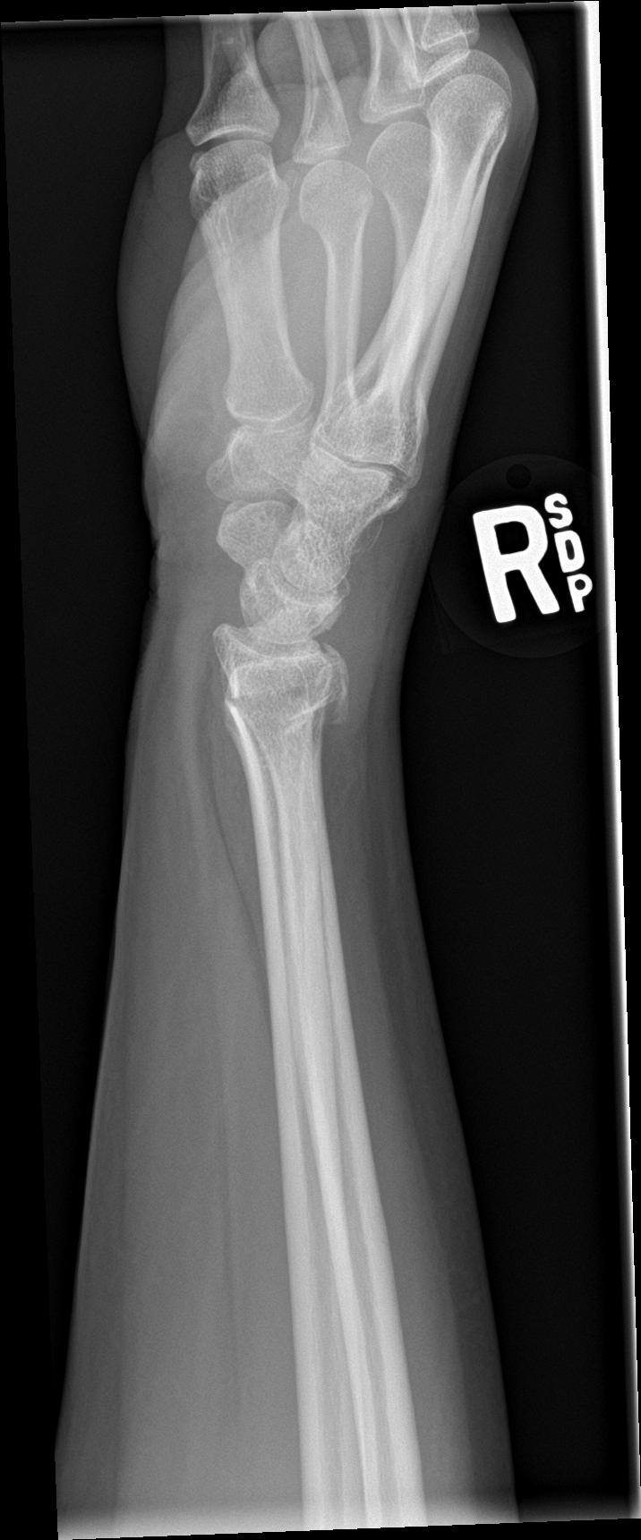
[im 4/4]
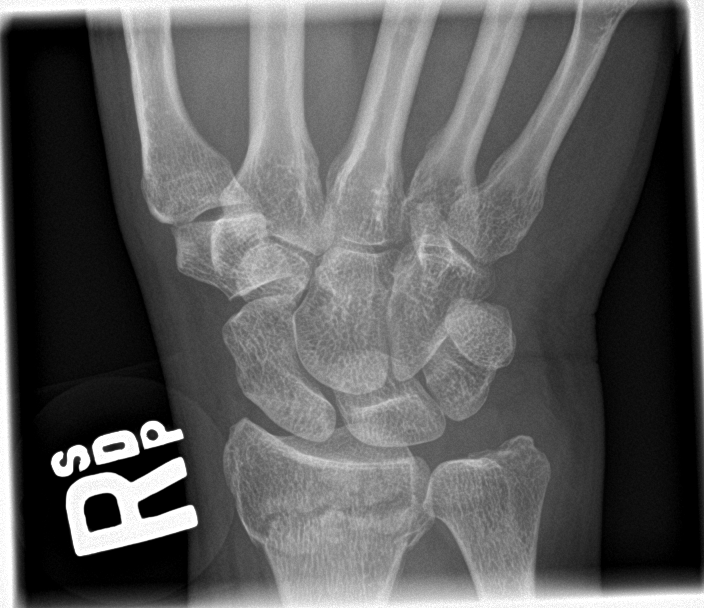

[4 of 4 positions shown; findings below may reference images not displayed]

FINDINGS: Distal radial fracture with intra-articular extension.

Distal ulna appears intact.

Mild soft tissue swelling.
IMPRESSION: Distal radial fracture with intra-articular extension.

## 2021-11-16 DIAGNOSIS — R87612 Low grade squamous intraepithelial lesion on cytologic smear of cervix (LGSIL): Secondary | ICD-10-CM | POA: Diagnosis not present

## 2021-11-16 DIAGNOSIS — Z130 Encounter for screening for diseases of the blood and blood-forming organs and certain disorders involving the immune mechanism: Secondary | ICD-10-CM | POA: Diagnosis not present

## 2021-11-16 DIAGNOSIS — R69 Illness, unspecified: Secondary | ICD-10-CM | POA: Diagnosis not present

## 2021-11-16 DIAGNOSIS — Z0001 Encounter for general adult medical examination with abnormal findings: Secondary | ICD-10-CM | POA: Diagnosis not present

## 2021-11-16 DIAGNOSIS — L309 Dermatitis, unspecified: Secondary | ICD-10-CM | POA: Diagnosis not present

## 2021-11-16 DIAGNOSIS — Z1329 Encounter for screening for other suspected endocrine disorder: Secondary | ICD-10-CM | POA: Diagnosis not present

## 2021-11-16 DIAGNOSIS — Z13220 Encounter for screening for lipoid disorders: Secondary | ICD-10-CM | POA: Diagnosis not present

## 2021-11-16 DIAGNOSIS — B356 Tinea cruris: Secondary | ICD-10-CM | POA: Diagnosis not present

## 2021-11-16 DIAGNOSIS — F411 Generalized anxiety disorder: Secondary | ICD-10-CM | POA: Diagnosis not present

## 2021-11-16 DIAGNOSIS — R030 Elevated blood-pressure reading, without diagnosis of hypertension: Secondary | ICD-10-CM | POA: Diagnosis not present

## 2021-11-16 DIAGNOSIS — Z131 Encounter for screening for diabetes mellitus: Secondary | ICD-10-CM | POA: Diagnosis not present

## 2021-11-20 ENCOUNTER — Emergency Department (HOSPITAL_BASED_OUTPATIENT_CLINIC_OR_DEPARTMENT_OTHER)
Admission: EM | Admit: 2021-11-20 | Discharge: 2021-11-20 | Disposition: A | Discharge status: Home / Self Care | Payer: 59 | Attending: Emergency Medicine | Admitting: Emergency Medicine

## 2021-11-20 ENCOUNTER — Other Ambulatory Visit: Payer: Self-pay

## 2021-11-20 DIAGNOSIS — T148XXA Other injury of unspecified body region, initial encounter: Secondary | ICD-10-CM

## 2021-11-20 DIAGNOSIS — S8991XA Unspecified injury of right lower leg, initial encounter: Secondary | ICD-10-CM | POA: Diagnosis not present

## 2021-11-20 DIAGNOSIS — X58XXXA Exposure to other specified factors, initial encounter: Secondary | ICD-10-CM | POA: Insufficient documentation

## 2021-11-20 DIAGNOSIS — S86911A Strain of unspecified muscle(s) and tendon(s) at lower leg level, right leg, initial encounter: Secondary | ICD-10-CM | POA: Diagnosis not present

## 2021-11-20 DIAGNOSIS — S39012A Strain of muscle, fascia and tendon of lower back, initial encounter: Secondary | ICD-10-CM | POA: Diagnosis not present

## 2021-11-20 MED ORDER — NAPROXEN 250 MG PO TABS
500.0000 mg | ORAL_TABLET | Freq: Once | ORAL | Status: AC
  Administered 2021-11-20: 500 mg via ORAL
  Filled 2021-11-20: qty 2

## 2021-11-20 MED ORDER — METHOCARBAMOL 500 MG PO TABS: 500.0000 mg | ORAL_TABLET | Freq: Two times a day (BID) | ORAL | 0 refills | Status: DC

## 2021-11-20 NOTE — ED Provider Notes (Signed)
  [facility] EMERGENCY DEPT Provider Note   CSN: [id number] Arrival date & time: 11/20/21  1229     History {Add pertinent medical, surgical, social history, OB history to HPI:1} Chief Complaint  Patient presents with   Leg Pain    Right Leg/Thigh    Gail Lopez is a 61 y.o. female.  HPI    61 year old female comes in with chief complaint of leg pain  Home Medications Prior to Admission medications   Medication Sig Start Date End Date Taking? Authorizing Provider  methocarbamol (ROBAXIN) 500 MG tablet Take 1 tablet (500 mg total) by mouth 2 (two) times daily. 11/20/21  Yes Derwood Kaplan, MD  ALPRAZolam Prudy Feeler) 1 MG tablet Take 1 mg by mouth at bedtime as needed.    [provider]  ondansetron (ZOFRAN) 4 MG tablet Take 1 tablet (4 mg total) by mouth every 8 (eight) hours as needed for nausea or vomiting. 05/06/18   Chinita Pester, FNP      Allergies    Darvon [propoxyphene]    Review of Systems   Review of Systems  Physical Exam Updated Vital Signs BP 135/83 (BP Location: Right Arm)   Pulse 97   Temp 98.4 F (36.9 C)   Resp 18   Ht 5\' 2"  (1.575 m)   Wt 80.3 kg   SpO2 97%   BMI 32.37 kg/m  Physical Exam  ED Results / Procedures / Treatments   Labs (all labs ordered are listed, but only abnormal results are displayed) Labs Reviewed - No data to display  EKG None  Radiology No results found.  Procedures Procedures  {Document cardiac monitor, telemetry assessment procedure when appropriate:1}  Medications Ordered in ED Medications  naproxen (NAPROSYN) tablet 500 mg (500 mg Oral Given 11/20/21 1457)    ED Course/ Medical Decision Making/ A&P                           Medical Decision Making Risk Prescription drug management.   ***  {Document critical care time when appropriate:1} {Document review of labs and clinical decision tools ie heart score, Chads2Vasc2 etc:1}  {Document your independent review of radiology  images, and any outside records:1} {Document your discussion with family members, caretakers, and with consultants:1} {Document social determinants of health affecting pt's care:1} {Document your decision making why or why not admission, treatments were needed:1} Final Clinical Impression(s) / ED Diagnoses Final diagnoses:  None    Rx / DC Orders ED Discharge Orders          Ordered    methocarbamol (ROBAXIN) 500 MG tablet  2 times daily        11/20/21 1522    acetaminophen (TYLENOL) 500 MG tablet  Every 6 hours PRN        Pending    naproxen (NAPROSYN) 500 MG tablet  2 times daily        Pending

## 2021-11-20 NOTE — ED Triage Notes (Addendum)
Patient arrives ambulatory to triage with complaints of recurrent radiating right thigh pain down her leg. Patient states that the pain has worsened to the point that she is needing a walker (not her baseline). No recent falls or injuries  Rates pain a 2/10 resting. 8/10 pain with movement. Also reports a stiff neck.

## 2021-11-20 NOTE — Discharge Instructions (Signed)
You are seen in the ER for groin pain.  Our suspicion is that likely you are having muscular pain.  We would like you to follow-up with orthopedic surgery for better localization of the cause for your pain and associated physical therapy if needed.  Until the appointment, continue using assistive device when walking.  Ice the area of discomfort 4-6 times a day for 15 minutes for the next 1 to 2 days.  Thereafter he can alternate between heat and ice.  Take ibuprofen 600 mg every 6 hours for the next 3 days and then as needed.  Muscle relaxants and Tylenol can be additionally taken if needed.

## 2022-01-21 DIAGNOSIS — Z7182 Exercise counseling: Secondary | ICD-10-CM | POA: Diagnosis not present

## 2022-01-21 DIAGNOSIS — R69 Illness, unspecified: Secondary | ICD-10-CM | POA: Diagnosis not present

## 2022-01-21 DIAGNOSIS — I10 Essential (primary) hypertension: Secondary | ICD-10-CM | POA: Diagnosis not present

## 2022-01-21 DIAGNOSIS — Z713 Dietary counseling and surveillance: Secondary | ICD-10-CM | POA: Diagnosis not present

## 2022-03-08 ENCOUNTER — Other Ambulatory Visit: Payer: Self-pay

## 2022-03-08 ENCOUNTER — Emergency Department (HOSPITAL_BASED_OUTPATIENT_CLINIC_OR_DEPARTMENT_OTHER): Payer: 59 | Admitting: Radiology

## 2022-03-08 ENCOUNTER — Encounter (HOSPITAL_BASED_OUTPATIENT_CLINIC_OR_DEPARTMENT_OTHER): Payer: Self-pay

## 2022-03-08 ENCOUNTER — Emergency Department (HOSPITAL_BASED_OUTPATIENT_CLINIC_OR_DEPARTMENT_OTHER)
Admission: EM | Admit: 2022-03-08 | Discharge: 2022-03-08 | Disposition: A | Discharge status: Home / Self Care | Payer: 59 | Attending: Emergency Medicine | Admitting: Emergency Medicine

## 2022-03-08 DIAGNOSIS — S99922A Unspecified injury of left foot, initial encounter: Secondary | ICD-10-CM | POA: Diagnosis not present

## 2022-03-08 DIAGNOSIS — W228XXA Striking against or struck by other objects, initial encounter: Secondary | ICD-10-CM | POA: Insufficient documentation

## 2022-03-08 DIAGNOSIS — I10 Essential (primary) hypertension: Secondary | ICD-10-CM | POA: Insufficient documentation

## 2022-03-08 DIAGNOSIS — S92515A Nondisplaced fracture of proximal phalanx of left lesser toe(s), initial encounter for closed fracture: Secondary | ICD-10-CM | POA: Insufficient documentation

## 2022-03-08 DIAGNOSIS — S62647A Nondisplaced fracture of proximal phalanx of left little finger, initial encounter for closed fracture: Secondary | ICD-10-CM | POA: Diagnosis not present

## 2022-03-08 HISTORY — DX: Essential (primary) hypertension: I10

## 2022-03-08 NOTE — ED Notes (Signed)
Reviewed AVS/discharge instruction with patient. Time allotted for and all questions answered. Patient is agreeable for d/c and escorted to ed exit by staff.  

## 2022-03-08 NOTE — ED Provider Notes (Signed)
[facility] EMERGENCY DEPT Provider Note   CSN: [id number] Arrival date & time: 03/08/22  2204     History  Chief Complaint  Patient presents with   Toe Pain    Gail Lopez is a 61 y.o. female.   Toe Pain   61 year old female presents emergency department with complaints of left pinky toe pain.  Patient states that she struck her toe on a piece of furniture this past Thursday when she was in [place].  She reports ambulating in flip-flops as well as boots since then but with subsequent pain.  She presents emergency department due to prolonged pain since incident of injury.  Denies any weakness or sensory deficits in affected foot.  Past medical history significant for hypertension, hypercholesterolemia, anemia, anxiety  Home Medications Prior to Admission medications   Medication Sig Start Date End Date Taking? Authorizing Provider  ALPRAZolam Prudy Feeler) 1 MG tablet Take 1 mg by mouth at bedtime as needed.    [provider]  methocarbamol (ROBAXIN) 500 MG tablet Take 1 tablet (500 mg total) by mouth 2 (two) times daily. 11/20/21   Derwood Kaplan, MD  ondansetron (ZOFRAN) 4 MG tablet Take 1 tablet (4 mg total) by mouth every 8 (eight) hours as needed for nausea or vomiting. 05/06/18   Chinita Pester, FNP      Allergies    Darvon [propoxyphene]    Review of Systems   Review of Systems  All other systems reviewed and are negative.   Physical Exam Updated Vital Signs BP (!) 157/81   Pulse 75   Temp 97.6 F (36.4 C) (Oral)   Resp 18   Ht 5\' 2"  (1.575 m)   Wt 81.6 kg   SpO2 98%   BMI 32.92 kg/m  Physical Exam Vitals and nursing note reviewed.  Constitutional:      General: She is not in acute distress.    Appearance: She is well-developed.  HENT:     Head: Normocephalic and atraumatic.  Eyes:     Conjunctiva/sclera: Conjunctivae normal.  Cardiovascular:     Rate and Rhythm: Normal rate and regular rhythm.     Heart sounds: No murmur  heard. Pulmonary:     Effort: Pulmonary effort is normal. No respiratory distress.     Breath sounds: Normal breath sounds.  Abdominal:     Palpations: Abdomen is soft.     Tenderness: There is no abdominal tenderness.  Musculoskeletal:        General: No swelling.     Cervical back: Neck supple.     Comments: Patient has full range of motion bilateral ankles, digits.  Pedal pulses full and intact bilaterally.  Patient has tenderness palpation along left pinky toe.  No obvious abnormality.  No obvious breaks in skin noted.  Patient complaining no sensory deficits distally.  Skin:    General: Skin is warm and dry.     Capillary Refill: Capillary refill takes less than 2 seconds.  Neurological:     Mental Status: She is alert.  Psychiatric:        Mood and Affect: Mood normal.     ED Results / Procedures / Treatments   Labs (all labs ordered are listed, but only abnormal results are displayed) Labs Reviewed - No data to display  EKG None  Radiology DG Foot Complete Left  Result Date: 03/08/2022 CLINICAL DATA:  Injury to left fifth digit/foot. EXAM: LEFT FOOT - COMPLETE 3+ VIEW COMPARISON:  None Available. FINDINGS: There is  a nondisplaced fracture of the shaft of the proximal phalanx of the fifth digit. The remaining bony structures are intact. No dislocation. Fixation hardware is noted at the distal fibula and medial malleolus. There is no evidence of arthropathy or other focal bone abnormality. Soft tissues are unremarkable. IMPRESSION: Nondisplaced fracture of the shaft of the proximal phalanx of the fifth digit. Electronically Signed   By: Thornell Sartorius M.D.   On: 03/08/2022 22:43    Procedures Procedures    Medications Ordered in ED Medications - No data to display  ED Course/ Medical Decision Making/ A&P                           Medical Decision Making Amount and/or Complexity of Data Reviewed Radiology: ordered.   This patient presents to the ED for concern of  toe pain, this involves an extensive number of treatment options, and is a complaint that carries with it a high risk of complications and morbidity.  The differential diagnosis includes fracture, strain/sprain, dislocation, osteoarthritis, rheumatoid arthritis, gout   Co morbidities that complicate the patient evaluation  See HPI   Additional history obtained:  Additional history obtained from EMR External records from outside source obtained and reviewed including hospital records   Lab Tests:  N/a   Imaging Studies ordered:  I ordered imaging studies including left foot x-ray  I independently visualized and interpreted imaging which showed nondisplaced fracture of shaft of proximal phalanx of fifth digit. I agree with the radiologist interpretation  Cardiac Monitoring: / EKG:  The patient was maintained on a cardiac monitor.  I personally viewed and interpreted the cardiac monitored which showed an underlying rhythm of: Sinus rhythm   Consultations Obtained:  N/a   Problem List / ED Course / Critical interventions / Medication management  Toe fracture Reevaluation of the patient showed that the patient stayed the same I have reviewed the patients home medicines and have made adjustments as needed   Social Determinants of Health:  Denies tobacco, licit drug use   Test / Admission - Considered:  Toe fracture Vitals signs significant for hypotension with a blood pressure of 157/81.  For recommend follow-up with primary care regarding elevated blood pressure.. Otherwise within normal range and stable throughout visit. Laboratory/imaging studies significant for: See above Patient with evidence of nondisplaced toe fracture.  Patient buddy taped and placed in postop shoe given fracture.  Patient recommended symptomatic therapy with rest, ice, elevation and NSAID therapy.  Follow-up with Dewaine Conger recommended given prior orthopedic establishment of care.  Treatment  plan discussed with patient and she was agreeable with said plan Worrisome signs and symptoms were discussed with the patient, and the patient acknowledged understanding to return to the ED if noticed. Patient was stable upon discharge.          Final Clinical Impression(s) / ED Diagnoses Final diagnoses:  Closed nondisplaced fracture of proximal phalanx of lesser toe of left foot, initial encounter    Rx / DC Orders ED Discharge Orders     None         Peter Garter, [place] 03/08/22 2356    Shon Baton, MD 03/09/22 [phone]

## 2022-03-08 NOTE — Discharge Instructions (Addendum)
Note the workup today was overall consistent with fracture of your left pinky toe.  As discussed, continue to buddy tape affected toes as well as wear postop shoe while you are walking around.  Call Dewaine Conger tomorrow to set up an appointment.  In the meantime, take Tylenol/Motrin as needed for pain.  Continue to rest, ice, elevate affected extremity.  Please not hesitate to return to emergency department for worrisome signs symptoms we discussed become apparent.

## 2022-03-08 NOTE — ED Triage Notes (Signed)
Pt states that she ran into a piece of furniture hitting her left fifth toe x 5 days ago. Pt complains of pain and swelling in toe.

## 2023-05-03 ENCOUNTER — Encounter (HOSPITAL_BASED_OUTPATIENT_CLINIC_OR_DEPARTMENT_OTHER): Payer: Self-pay | Admitting: Internal Medicine

## 2023-05-03 DIAGNOSIS — R0683 Snoring: Secondary | ICD-10-CM

## 2023-11-07 NOTE — Procedures (Signed)
 Gail Lopez

## 2024-03-06 NOTE — Progress Notes (Addendum)
 [facility]  [phone]   HEMATOLOGY ONCOLOGY CONSULTATION   Gail Lopez  DOB: [birth year]  MR#: [id number]  CSN#: [id number]    Requesting Physician: Arlyne Aid, FNP  Reason for consult: hemoglobinopathy; concern for alpha thalassemia   History of present illness:   The patient has history of type 2 diabetes, hypertension, and high cholesterol.  She was referred by her primary care provider at her [facility] Department of [address] and [employer].  The patient had persistently elevated RBC count with low MCV, low MCH, and low MCHC over the past 2 years.  Evaluation for iron deficiency anemia has previously been negative.  Most recent blood work was done 11/28/2023.  Her RBC was 5.71.  MCV low at 73.2, MCH low at 21.5.  Her Hgb and HCT were both normal at 12.3 and 41.8 respectively.  Prior to that, on  11/02/2023, RBCs elevated at 5.94, MCV low at 71.5, MCH low at 21.2, and MCHC low at 29.6.  Ferritin was normal at 58, iron was normal at 46, TIBC normal at 282, and percent saturation was normal at 16.  Prior to that, she had labs in March 2025.  Her RBC was high at 5.99, MCV low at 71.0, MCH of 21.7, and MCHC low at 30.6.  Lactate, iron was normal at 51, IBC normal at 305, percent saturation normal at 17.  Ferritin was 72.  Reviewed labs from 10/25/2011, RBC elevated at 5.43, MCV low at 65.6, MCH low at 22.7, Hgb normal at 12.3, HCT slightly low at 35.6. Today, the patient is primarily asymptomatic. She has noted some intermittent shortness of breath with exertion.  Attributes this to her wrist to her weight.  She denies other concerns or complaints. She denies chest pain, chest pressure, or palpitations. She denies headaches or visual disturbances. She denies abdominal pain, nausea, vomiting, or changes in bowel or bladder habits.  Her was recently diagnosed as having alpha thalassemia trait. This was diagnosed during current pregnancy.  This is her second pregnancy.   First pregnancy had no complications.  The patient states that to her knowledge, she has never she has never been diagnosed with significant anemia.  States she may have been diagnosed with anemia after her pregnancies, but nothing in the last 30 years or so.  Socially, the patient is single.  She lives she lives in [place], [place].  States her adult son lives with her intermittently.  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  She denies smoking.  She she would drink socially.  She denies any drug use.   MEDICAL HISTORY:  Past Medical History:  Diagnosis Date   Anxiety    History of anemia    Hypercholesteremia    Hypertension    Low back pain    resolved   UTI (urinary tract infection)    Vitamin D deficiency    Wears glasses    Wrist fracture 05/2018   Right    SURGICAL HISTORY: Past Surgical History:  Procedure Laterality Date   ABLATION     endometrial   ANKLE SURGERY Left    CESAREAN SECTION     two   ORIF RADIAL FRACTURE Right 05/15/2018   Procedure: OPEN REDUCTION INTERNAL FIXATION (ORIF)DISTAL RADIAL INTRA-ARTICULAR FRACTURE 3 + FRAGMENTS;  Surgeon: Beverley Evalene BIRCH, MD;  Location: [facility];  Service: Orthopedics;  Laterality: Right;    SOCIAL HISTORY: Social History   Socioeconomic History  Marital status: Single    Spouse name: Not on file   Number of children: 2   Years of education: Not on file   Highest education level: Not on file  Occupational History   Occupation: CNA    Comment: [employer]  Tobacco Use   Smoking status: Never   Smokeless tobacco: Never  Vaping Use   Vaping status: Never Used  Substance and Sexual Activity   Alcohol use: Yes    Comment: socially   Drug use: No   Sexual activity: Not on file  Other Topics Concern   Not on file  Social History Narrative   Lives with parents and sister   Social Drivers of Corporate Investment Banker Strain: Not on file  Food  Insecurity: Not on file  Transportation Needs: Not on file  Physical Activity: Not on file  Stress: Not on file  Social Connections: Not on file  Intimate Partner Violence: Not on file    FAMILY HISTORY: Family History  Problem Relation Age of Onset   Diabetes Mother    Hyperlipidemia Mother    Hypertension Mother    Heart attack Father    Stroke Father    Lung cancer Father        cured   Ulcerative colitis Daughter    Sudden death Neg Hx     ALLERGIES:  is allergic to darvon [propoxyphene].  MEDICATIONS:  Current Outpatient Medications  Medication Sig Dispense Refill   amLODipine (NORVASC) 5 MG tablet Take 5 mg by mouth daily.     cholecalciferol (VITAMIN D3) 25 MCG (1000 UNIT) tablet Take 1,000 Units by mouth daily.     metformin (FORTAMET) 500 MG (OSM) 24 hr tablet Take 500 mg by mouth daily with breakfast.     ondansetron  (ZOFRAN ) 4 MG tablet Take 1 tablet (4 mg total) by mouth every 8 (eight) hours as needed for nausea or vomiting. 20 tablet 0   rosuvastatin (CRESTOR) 5 MG tablet Take 5 mg by mouth daily.     No current facility-administered medications for this visit.    REVIEW OF SYSTEMS:   Constitutional: Denies fevers, chills or abnormal night sweats Eyes: Denies blurriness of vision, double vision or watery eyes Ears, nose, mouth, throat, and face: Denies mucositis or sore throat Respiratory: Denies cough, dyspnea or wheezes Cardiovascular: Denies palpitation, chest discomfort or lower extremity swelling. States that she does get winded with activity from time to time.  Gastrointestinal:  Denies nausea, heartburn or change in bowel habits Skin: Denies abnormal skin rashes Lymphatics: Denies new lymphadenopathy or easy bruising Neurological:Denies numbness, tingling or new weaknesses Behavioral/Psych: Mood is stable, no new changes  All other systems were reviewed with the patient and are negative.  PHYSICAL EXAMINATION: ECOG PERFORMANCE STATUS: 1 -  Symptomatic but completely ambulatory  Vitals:   03/07/24 1424  BP: 136/88  Pulse: 95  Resp: 17  Temp: 98 F (36.7 C)  SpO2: 98%   Filed Weights   03/07/24 1424  Weight: 177 lb 4.8 oz (80.4 kg)    GENERAL:alert, no distress and comfortable SKIN: skin color, texture, turgor are normal, no rashes or significant lesions EYES: normal, conjunctiva are pink and non-injected, sclera clear OROPHARYNX:no exudate, no erythema and lips, buccal mucosa, and tongue normal  NECK: supple, thyroid  normal size, non-tender, without nodularity LYMPH:  no palpable lymphadenopathy in the cervical, axillary or inguinal LUNGS: clear to auscultation and percussion with normal breathing effort HEART: regular rate & rhythm and  no murmurs and no lower extremity edema ABDOMEN:abdomen soft, non-tender and normal bowel sounds Musculoskeletal:no cyanosis of digits and no clubbing  PSYCH: alert & oriented x 3 with fluent speech NEURO: no focal motor/sensory deficits  LABORATORY DATA:  I have reviewed the data as listed Lab Results  Component Value Date   WBC 4.3 03/07/2024   HGB 12.7 03/07/2024   HCT 39.7 03/07/2024   MCV 67.6 (L) 03/07/2024   PLT 185 03/07/2024     ASSESSMENT & PLAN:  Hemoglobinopathy Assessment & Plan: The patient has history of type 2 diabetes, hypertension, and high cholesterol.  She was referred by her primary care provider at her [facility] Department of [address] and [employer].  The patient had persistently elevated RBC count with low MCV, low MCH, and low MCHC over the past 2 years.  Evaluation for iron deficiency anemia has previously been negative.  Most recent blood work was done 11/28/2023.  Her RBC was 5.71.  MCV low at 73.2, MCH low at 21.5.  Her Hgb and HCT were both normal at 12.3 and 41.8 respectively.  Prior to that, on  11/02/2023, RBCs elevated at 5.94, MCV low at 71.5, MCH low at 21.2, and MCHC low at 29.6.  Ferritin was normal at 58, iron was normal at 46, TIBC normal at  282, and percent saturation was normal at 16.  Prior to that, she had labs in March 2025.  Her RBC was high at 5.99, MCV low at 71.0, MCH of 21.7, and MCHC low at 30.6.  Lactate, iron was normal at 51, IBC normal at 305, percent saturation normal at 17.  Ferritin was 72.  Reviewed labs from 10/25/2011, RBC elevated at 5.43, MCV low at 65.6, MCH low at 22.7, Hgb normal at 12.3, HCT slightly low at 35.6. Patient's daughter, pregnant with her second with her second child, recently underwent genetic testing.  She was positive for alpha thalassemia trait.  The patient is unsure if her daughter's father was a carrier of alpha thalassemia or positive for alpha thalassemia trait.  The patient does not remember her parents discussing diagnosis of thalassemia at any point.  We discussed genetic components of both alpha and beta thalassemia.  Will test hemoglobin electrophoresis and perform genetic test for alpha thalassemia.  Will also check for iron deficiency anemia as this can be a cause of microcytic, hypochromic anemia.  Prior iron studies have been normal.  If positive for alpha thalassemia, the patient does not currently need to be treated as she is not anemic and has not had any recent history of anemia. It would be recommended that her siblings and children be tested for alpha thalassemia.  Blood count can be monitored by primary care and any subsequent iron deficiency can be managed as needed.  Plan to contact the patient via phone with lab results when they are available.    Orders: -     Hgb Fractionation Cascade; Future -     Reticulocytes; Future -     Iron and Iron Binding Capacity ([facility] only); Future -     Ferritin; Future -     CBC with Differential ([facility]cer [facility] Only); Future -     Alpha-Thalassemia Analysis; Future    This was a shared visit with Dr. Lanny. All questions were answered. The patient knows to call the clinic with any problems, questions or concerns.      Powell FORBES Lessen,  NP 03/07/2024 3:45 PM  Addendum I have seen the patient,  examined her. I agree with the assessment and and plan and have edited the notes.   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  Will obtain hemoglobin electrophoresis, and alpha thalassemia genetic testing for her.  Will also check iron level.  We discussed that if she does have thalassemia, she only has thalassemia trait, and does not require any treatment.  We discussed well hydration, and to take a multivitamin, all questions were answered.  I spent a total of 30 minutes for her visit today.  Onita Mattock MD 03/07/2024

## 2024-03-07 ENCOUNTER — Inpatient Hospital Stay: Attending: Nurse Practitioner | Admitting: Nurse Practitioner

## 2024-03-07 ENCOUNTER — Inpatient Hospital Stay

## 2024-03-07 VITALS — BP 136/88 | HR 95 | Temp 98.0°F | Resp 17 | Wt 177.3 lb

## 2024-03-07 DIAGNOSIS — R0602 Shortness of breath: Secondary | ICD-10-CM | POA: Diagnosis not present

## 2024-03-07 DIAGNOSIS — D582 Other hemoglobinopathies: Secondary | ICD-10-CM | POA: Insufficient documentation

## 2024-03-07 DIAGNOSIS — I10 Essential (primary) hypertension: Secondary | ICD-10-CM | POA: Insufficient documentation

## 2024-03-07 DIAGNOSIS — E7800 Pure hypercholesterolemia, unspecified: Secondary | ICD-10-CM | POA: Insufficient documentation

## 2024-03-07 DIAGNOSIS — E119 Type 2 diabetes mellitus without complications: Secondary | ICD-10-CM | POA: Insufficient documentation

## 2024-03-07 DIAGNOSIS — D563 Thalassemia minor: Secondary | ICD-10-CM | POA: Diagnosis present

## 2024-03-07 DIAGNOSIS — Z801 Family history of malignant neoplasm of trachea, bronchus and lung: Secondary | ICD-10-CM | POA: Diagnosis not present

## 2024-03-07 LAB — CBC WITH DIFFERENTIAL (CANCER CENTER ONLY)
Abs Immature Granulocytes: 0.01 K/uL (ref 0.00–0.07)
Basophils Absolute: 0 K/uL (ref 0.0–0.1)
Basophils Relative: 1 %
Eosinophils Absolute: 0.1 K/uL (ref 0.0–0.5)
Eosinophils Relative: 1 %
HCT: 39.7 % (ref 36.0–46.0)
Hemoglobin: 12.7 g/dL (ref 12.0–15.0)
Immature Granulocytes: 0 %
Lymphocytes Relative: 35 %
Lymphs Abs: 1.5 K/uL (ref 0.7–4.0)
MCH: 21.6 pg — ABNORMAL LOW (ref 26.0–34.0)
MCHC: 32 g/dL (ref 30.0–36.0)
MCV: 67.6 fL — ABNORMAL LOW (ref 80.0–100.0)
Monocytes Absolute: 0.4 K/uL (ref 0.1–1.0)
Monocytes Relative: 10 %
Neutro Abs: 2.3 K/uL (ref 1.7–7.7)
Neutrophils Relative %: 53 %
Platelet Count: 185 K/uL (ref 150–400)
RBC: 5.87 MIL/uL — ABNORMAL HIGH (ref 3.87–5.11)
RDW: 16.1 % — ABNORMAL HIGH (ref 11.5–15.5)
WBC Count: 4.3 K/uL (ref 4.0–10.5)
nRBC: 0 % (ref 0.0–0.2)

## 2024-03-07 LAB — RETICULOCYTES
Immature Retic Fract: 5.2 % (ref 2.3–15.9)
RBC.: 5.84 MIL/uL — ABNORMAL HIGH (ref 3.87–5.11)
Retic Count, Absolute: 53.7 K/uL (ref 19.0–186.0)
Retic Ct Pct: 0.9 % (ref 0.4–3.1)

## 2024-03-07 LAB — IRON AND IRON BINDING CAPACITY (CC-WL,HP ONLY)
Iron: 77 ug/dL (ref 28–170)
Saturation Ratios: 23 % (ref 10.4–31.8)
TIBC: 339 ug/dL (ref 250–450)
UIBC: 262 ug/dL

## 2024-03-07 LAB — FERRITIN: Ferritin: 112 ng/mL (ref 11–307)

## 2024-03-07 NOTE — Assessment & Plan Note (Addendum)
 The patient has history of type 2 diabetes, hypertension, and high cholesterol.  She was referred by her primary care provider at her [facility] Department of [address] [facility].  The patient had persistently elevated RBC count with low MCV, low MCH, and low MCHC over the past 2 years.  Evaluation for iron deficiency anemia has previously been negative.  Most recent blood work was done 11/28/2023.  Her RBC was 5.71.  MCV low at 73.2, MCH low at 21.5.  Her Hgb and HCT were both normal at 12.3 and 41.8 respectively.  Prior to that, on  11/02/2023, RBCs elevated at 5.94, MCV low at 71.5, MCH low at 21.2, and MCHC low at 29.6.  Ferritin was normal at 58, iron was normal at 46, TIBC normal at 282, and percent saturation was normal at 16.  Prior to that, she had labs in March 2025.  Her RBC was high at 5.99, MCV low at 71.0, MCH of 21.7, and MCHC low at 30.6.  Lactate, iron was normal at 51, IBC normal at 305, percent saturation normal at 17.  Ferritin was 72.  Reviewed labs from 10/25/2011, RBC elevated at 5.43, MCV low at 65.6, MCH low at 22.7, Hgb normal at 12.3, HCT slightly low at 35.6. Patient's daughter, pregnant with her second with her second child, recently underwent genetic testing.  She was positive for alpha thalassemia trait.  The patient is unsure if her daughter's father was a carrier of alpha thalassemia or positive for alpha thalassemia trait.  The patient does not remember her parents discussing diagnosis of thalassemia at any point.  We discussed genetic components of both alpha and beta thalassemia.  Will test hemoglobin electrophoresis and perform genetic test for alpha thalassemia.  Will also check for iron deficiency anemia as this can be a cause of microcytic, hypochromic anemia.  Prior iron studies have been normal.  If positive for alpha thalassemia, the patient does not currently need to be treated as she is not anemic and has not had any recent history of anemia. It would be recommended that  her siblings and children be tested for alpha thalassemia.  Blood count can be monitored by primary care and any subsequent iron deficiency can be managed as needed.  Plan to contact the patient via phone with lab results when they are available.

## 2024-03-10 LAB — HGB FRACTIONATION CASCADE
Hgb A2: 2.1 % (ref 1.8–3.2)
Hgb A: 97.9 % (ref 96.4–98.8)
Hgb F: 0 % (ref 0.0–2.0)
Hgb S: 0 %

## 2024-03-18 LAB — ALPHA-THALASSEMIA ANALYSIS: IMAGE: 0
